# Patient Record
Sex: Female | Born: 1965
Health system: Southern US, Community
[De-identification: ages and names within clinical notes are randomized; demographics above are authoritative.]

## PROBLEM LIST (undated history)

## (undated) DIAGNOSIS — Z800 Family history of malignant neoplasm of digestive organs: Secondary | ICD-10-CM

## (undated) DIAGNOSIS — Z87442 Personal history of urinary calculi: Secondary | ICD-10-CM

## (undated) DIAGNOSIS — Z803 Family history of malignant neoplasm of breast: Secondary | ICD-10-CM

## (undated) DIAGNOSIS — K219 Gastro-esophageal reflux disease without esophagitis: Secondary | ICD-10-CM

## (undated) DIAGNOSIS — M1990 Unspecified osteoarthritis, unspecified site: Secondary | ICD-10-CM

## (undated) DIAGNOSIS — I10 Essential (primary) hypertension: Secondary | ICD-10-CM

## (undated) DIAGNOSIS — J45909 Unspecified asthma, uncomplicated: Secondary | ICD-10-CM

## (undated) DIAGNOSIS — R7303 Prediabetes: Secondary | ICD-10-CM

## (undated) DIAGNOSIS — E669 Obesity, unspecified: Secondary | ICD-10-CM

## (undated) HISTORY — DX: Family history of malignant neoplasm of breast: Z80.3

## (undated) HISTORY — PX: COLONOSCOPY: SHX174

## (undated) HISTORY — DX: Family history of malignant neoplasm of digestive organs: Z80.0

---

## 1998-08-12 ENCOUNTER — Encounter: Payer: Self-pay | Admitting: Emergency Medicine

## 1998-08-12 ENCOUNTER — Emergency Department (HOSPITAL_COMMUNITY): Admission: EM | Admit: 1998-08-12 | Discharge: 1998-08-12 | Payer: Self-pay | Admitting: Emergency Medicine

## 1998-09-19 ENCOUNTER — Other Ambulatory Visit: Admission: RE | Admit: 1998-09-19 | Discharge: 1998-09-19 | Payer: Self-pay | Admitting: Obstetrics and Gynecology

## 1999-06-11 ENCOUNTER — Ambulatory Visit (HOSPITAL_COMMUNITY): Admission: RE | Admit: 1999-06-11 | Discharge: 1999-06-11 | Payer: Self-pay | Admitting: Obstetrics and Gynecology

## 1999-06-11 ENCOUNTER — Encounter: Payer: Self-pay | Admitting: Obstetrics and Gynecology

## 1999-07-23 ENCOUNTER — Inpatient Hospital Stay (HOSPITAL_COMMUNITY): Admission: AD | Admit: 1999-07-23 | Discharge: 1999-07-23 | Payer: Self-pay | Admitting: Obstetrics & Gynecology

## 1999-10-31 ENCOUNTER — Encounter (INDEPENDENT_AMBULATORY_CARE_PROVIDER_SITE_OTHER): Payer: Self-pay

## 1999-10-31 ENCOUNTER — Inpatient Hospital Stay (HOSPITAL_COMMUNITY): Admission: AD | Admit: 1999-10-31 | Discharge: 1999-11-02 | Payer: Self-pay | Admitting: Obstetrics and Gynecology

## 2000-02-06 ENCOUNTER — Encounter: Payer: Self-pay | Admitting: Emergency Medicine

## 2000-02-06 ENCOUNTER — Emergency Department (HOSPITAL_COMMUNITY): Admission: EM | Admit: 2000-02-06 | Discharge: 2000-02-06 | Payer: Self-pay | Admitting: Emergency Medicine

## 2000-03-10 ENCOUNTER — Encounter: Admission: RE | Admit: 2000-03-10 | Discharge: 2000-03-10 | Payer: Self-pay | Admitting: Otolaryngology

## 2000-03-10 ENCOUNTER — Encounter: Payer: Self-pay | Admitting: Otolaryngology

## 2000-07-15 HISTORY — PX: ABLATION: SHX5711

## 2000-07-29 ENCOUNTER — Encounter: Payer: Self-pay | Admitting: Internal Medicine

## 2000-07-29 ENCOUNTER — Ambulatory Visit (HOSPITAL_COMMUNITY): Admission: RE | Admit: 2000-07-29 | Discharge: 2000-07-29 | Payer: Self-pay | Admitting: Internal Medicine

## 2000-07-29 ENCOUNTER — Encounter (INDEPENDENT_AMBULATORY_CARE_PROVIDER_SITE_OTHER): Payer: Self-pay | Admitting: Specialist

## 2001-10-01 ENCOUNTER — Ambulatory Visit (HOSPITAL_COMMUNITY): Admission: RE | Admit: 2001-10-01 | Discharge: 2001-10-01 | Payer: Self-pay | Admitting: Family Medicine

## 2001-10-01 ENCOUNTER — Encounter: Payer: Self-pay | Admitting: Family Medicine

## 2001-12-14 ENCOUNTER — Encounter: Payer: Self-pay | Admitting: Family Medicine

## 2001-12-14 ENCOUNTER — Ambulatory Visit (HOSPITAL_COMMUNITY): Admission: RE | Admit: 2001-12-14 | Discharge: 2001-12-14 | Payer: Self-pay | Admitting: Family Medicine

## 2002-01-06 ENCOUNTER — Encounter: Payer: Self-pay | Admitting: Cardiology

## 2002-01-06 ENCOUNTER — Ambulatory Visit (HOSPITAL_COMMUNITY): Admission: RE | Admit: 2002-01-06 | Discharge: 2002-01-06 | Payer: Self-pay | Admitting: Family Medicine

## 2002-05-25 ENCOUNTER — Encounter: Admission: RE | Admit: 2002-05-25 | Discharge: 2002-08-23 | Payer: Self-pay | Admitting: Family Medicine

## 2003-09-14 ENCOUNTER — Other Ambulatory Visit: Admission: RE | Admit: 2003-09-14 | Discharge: 2003-09-14 | Payer: Self-pay | Admitting: Obstetrics and Gynecology

## 2003-11-22 ENCOUNTER — Inpatient Hospital Stay (HOSPITAL_COMMUNITY): Admission: RE | Admit: 2003-11-22 | Discharge: 2003-11-23 | Payer: Self-pay | Admitting: Oral Surgery

## 2005-03-25 ENCOUNTER — Encounter: Admission: RE | Admit: 2005-03-25 | Discharge: 2005-03-25 | Payer: Self-pay | Admitting: Internal Medicine

## 2005-12-24 ENCOUNTER — Other Ambulatory Visit: Admission: RE | Admit: 2005-12-24 | Discharge: 2005-12-24 | Payer: Self-pay | Admitting: Obstetrics and Gynecology

## 2006-01-01 ENCOUNTER — Encounter: Admission: RE | Admit: 2006-01-01 | Discharge: 2006-01-01 | Payer: Self-pay

## 2006-04-03 ENCOUNTER — Ambulatory Visit (HOSPITAL_COMMUNITY): Admission: RE | Admit: 2006-04-03 | Discharge: 2006-04-03 | Payer: Self-pay | Admitting: Obstetrics and Gynecology

## 2006-04-03 ENCOUNTER — Encounter (INDEPENDENT_AMBULATORY_CARE_PROVIDER_SITE_OTHER): Payer: Self-pay | Admitting: Specialist

## 2010-07-02 ENCOUNTER — Encounter
Admission: RE | Admit: 2010-07-02 | Discharge: 2010-07-02 | Payer: Self-pay | Source: Home / Self Care | Attending: Family Medicine | Admitting: Family Medicine

## 2010-11-30 NOTE — Discharge Summary (Signed)
[facility]  Patient:    Christine Howell, Christine Howell                        MRN: [id number] Adm. Date:  [id number] Disc. Date: 11/02/99 Attending:  Shaune Spittle Dictator:   Miguel Dibble, C.N.M.                           Discharge Summary  DATE OF BIRTH:                February 03, 1966  ADMISSION DIAGNOSES:          1. Intrauterine pregnancy at term in active labor.                               2. Positive group Beta strep.                               3. Multiparity desiring permanent sterilization.  DISCHARGE DIAGNOSES:          1. Intrauterine pregnancy at term in active labor.                               2. Positive group Beta strep.                               3. Multiparity desiring permanent sterilization.                               4. Delivered viable baby girl, weight 7 pounds                                  15 ounces, Apgars 6 and 8.  PROCEDURES:                   1. Epidural and general anesthesia.                               2. Bilateral tubal ligation for permanent                                  sterilization.                               3. Electronic fetal monitoring.                               4. Repair of second degree midline laceration.  COURSE OF HOSPITALIZATION:    On April 18, 45 year old [name] was admitted in early active labor, 3 cm, completely effaced, vertex at -1 with bulging bag of waters.  She was treated for positive group Beta strep.  Approximately one hour  after admission, at approximately 1550, she had a normal spontaneous vaginal delivery over an intact perineum of a viable baby girl, Natashia, weighing 7 pounds 15 ounces,  Apgars 6 and 8.  She had her second degree midline perineal laceration repaired without difficulty.  She requested a postpartum tubal ligation, which as performed on postpartum day #1, April 19.  This was an uncomplicated procedure. On April 20, postoperative day  #1/postpartum day #2, her vital signs were stable. She was afebrile.  Her hemoglobin had dropped from 13 prior to delivery to 10.7 after delivery, with a white count of 14.7.  She remained afebrile and was recovering  satisfactorily, her incision clean, dry, and intact.  She was discharged after having been deemed to have received the full benefit of hospitalization.  DISCHARGE INSTRUCTIONS:       Per [facility] booklet.  DISCHARGE MEDICATIONS:        Tylox, Motrin, prenatal vitamins, over-the-counter iron.  DISCHARGE FOLLOW-UP:          In six weeks and [facility]. DD:  11/02/99 TD:  11/02/99 Job: [id number]

## 2010-11-30 NOTE — Op Note (Signed)
NAME:  Christine Howell, Christine Howell NO.:  [id number]   MEDICAL RECORD NO.:  [id number]          PATIENT TYPE:  AMB   LOCATION:  SDC                           FACILITY:  [facility]   PHYSICIAN:  Janine Limbo, M.D.DATE OF BIRTH:  Aug 17, 1965   DATE OF PROCEDURE:  04/03/2006  DATE OF DISCHARGE:                                 OPERATIVE REPORT   PREOPERATIVE DIAGNOSIS:  1. Menorrhagia  2. Endometrial polyp.   POSTOPERATIVE DIAGNOSIS:  1. Menorrhagia  2. Endometrial polyp.   PROCEDURE:  1. Hysteroscopy with resection of an endometrial polyp.  2. Dilatation and curettage.  3. Novasure ablation of the endometrium.   SURGEON:  Janine Limbo, M.D.   FIRST ASSISTANT:  None.   ANESTHESIA:  General.   DISPOSITION:  Christine Howell is a 45 year old female, para 2-0-1-2, who  presents with the above mentioned diagnosis.  The patient understands the  indications for her surgical procedure and she accepts the risks of, but not  limited to, anesthetic complications, bleeding, infection, and possible  damage to surrounding organs.   FINDINGS:  The uterus was upper limits of normal size.  No adnexal masses  were appreciated.  On hysteroscopy, the patient was noted to have a 1 cm  endometrial polyp on the right anterior surface of the uterus.   PROCEDURE:  The patient was taken to the operating room where a general  anesthetic was given.  The patient's abdomen, perineum, and vagina were  prepped with multiple layers of Betadine.  The bladder was drained of urine.  The patient was then sterilely draped.  Examination under anesthesia was  performed.  A paracervical block was placed using 10 mL of 0.5% Marcaine  with epinephrine.  An additional 10 mL of 0.5% Marcaine with epinephrine  were injected directly into the cervix.  An endocervical curettage was then  obtained.  The uterus was sounded to 9 cm.  The cervix was gently dilated.  The diagnostic hysteroscope was inserted  and the endometrial cavity was  carefully inspected.  Pictures were taken.  The diagnostic hysteroscope was  removed and the operative hysteroscope was then inserted.  The polyp was  removed using the double loop resection instrument.  Hemostasis was  confirmed.  The cavity was then curetted until the entire cavity was felt to  be clean.  The instruments were removed and the Novasure apparatus was  inserted.  The cavity width was noted to be 3.8 cm.  The cervical length was  noted to be 4 cm.  Therefore, the cavity length was felt to be 5 cm.  The  endometrial cavity was then ablated for total of 75 seconds.  At the end of  the ablation cycle, all instruments were removed.  The hysteroscope was  again inserted and the cavity was carefully inspected.  The cavity was noted  to be totally ablated.  Examination under anesthesia was then repeated.  The  patient was returned to the supine position.  She was awakened from her  anesthetic and taken to the recovery room in stable condition.  Sponge,  needle, and instrument counts were correct.  The  estimated blood loss for  this procedure was 20 mL.  Lactated Ringer's was used for the initial  hysteroscopy portion of the procedure.  The lactated Ringer's was irrigated  from the system and then sorbitol was used for the resection portion of this  procedure.  The sorbitol was vigorously irrigated and then lactated Ringer's  was again used for the ablation portion of the procedure.  There was a 60 mL  deficit of lactated Ringer's.  There was a 45 mL deficit of sorbitol.  Fluid  was noted to be on the operating room floor, however.  The endocervical  curettage, the endometrial resections, and the endometrial curettings were  all sent to pathology for evaluation.  The patient was taken to the recovery  room in stable condition.   FOLLOW-UP INSTRUCTIONS:  The patient will take ibuprofen 600 mg every 6  hours as needed for mild to moderate pain.  She will  take Vicodin 1-2  tablets every 4 hours as needed for severe pain.  She will return to see Dr.  Stefano Gaul in two weeks for follow-up examination.  She was given a copy of  the postoperative instruction sheet as prepared by the [facility]  [facility] for patients who have undergone a dilatation and curettage.      Janine Limbo, M.D.  Electronically Signed     AVS/MEDQ  D:  04/03/2006  T:  04/05/2006  Job:  [id number]

## 2010-11-30 NOTE — Op Note (Signed)
[facility]  Patient:    Christine Howell, Christine Howell                        MRN: [id number] Proc. Date: 11/01/99 Adm. Date:  [id number] Attending:  Dierdre Forth Pearline                           Operative Report  PREOPERATIVE DIAGNOSIS:       Postpartum elective sterilization.  POSTOPERATIVE DIAGNOSIS:      Postpartum elective sterilization.  OPERATION PERFORMED:          Postpartum tubal ligation by Pomeroy method.  SURGEON:                      Georgina Peer, M.D.  ANESTHESIA:                   Epidural, converted to general because of ineffective epidural, Dr. Dorinda Hill T. Shafer.  BLOOD LOSS:                   Less than 50 cc.  FINDINGS:                     Normal tubes and ovaries.  INDICATIONS:                  This 45 year old female, gravida 3, para 2, delivered and wanted tubal ligation.  She was aware of risks and complications of tubal ligation including bleeding, infection, 1-2% lifetime failure risk and was willing to proceed.  DESCRIPTION OF PROCEDURE:     Patient was taken to the operating room.  Her epidural was redosed but after 35 minutes of waiting, the patient had still severe pain with an Allis test.  It was decided by Dr. Pamalee Leyden that a general anesthetic would be at her best interest and she then had a general anesthesia.  Her abdomen was prepped and draped.  A subumbilical incision was made and taken through the  fascia and into the peritoneal cavity sharply.  There were no adhesions around he site.  The right tube was identified and traced to its fimbriated end, a midportion knuckle elevated and two loops of plain gut placed around it and the midportion  excised.  Bleeders were cauterized.  The left tube was similarly identified, traced to its fimbriated end, midportion elevated, two plain ties tied around it and the midportion excised.  Bleeders were cauterized.  The fascia was closed with 0 Vicryl, 10 cc of Marcaine  0.25% injected in the subcutaneous tissue and the skin closed with Dexon.  Sponge, needle and instrument counts were correct.  Patient  tolerated the procedure well and was sent to recovery area in good condition. DD:  11/01/99 TD:  11/02/99 Job: [id number]

## 2010-11-30 NOTE — Op Note (Signed)
NAME:  Christine, Howell NO.:  [id number]   MEDICAL RECORD NO.:  [id number]                   PATIENT TYPE:  INP   LOCATION:  2550                                 FACILITY:  [facility]   PHYSICIAN:  Sable Feil., D.D.S.        DATE OF BIRTH:  04-Dec-1965   DATE OF PROCEDURE:  11/22/2003  DATE OF DISCHARGE:                                 OPERATIVE REPORT   PREOPERATIVE DIAGNOSIS:  Posterior vertical maxillary hyperplasia and  apertognathia.   POSTOPERATIVE DIAGNOSIS:  Posterior vertical maxillary hyperplasia and  apertognathia.   PROCEDURE:  Ignatius Specking I maxillary osteotomy with rigid internal fixation.   ANESTHESIA:  General via naso-endotracheal intubation.   PROCEDURE IN DETAIL:  The patient was brought to the operating room in  satisfactory preoperative condition and placed on the operating room table  in the supine position.  Following successful induction of general  anesthesia via naso-endotracheal intubation, the patient was prepped and  draped in the usual sterile fashion for a procedure of this type.  Initially, the oral cavity was irrigated with normal saline and suctioned  dry and an oropharyngeal throat pack was placed which was removed at the  conclusion of the case.  Next, the maxilla was anesthetized with  approximately 4 cc of a 0.5% Marcaine solution with 1:200,000 epinephrine as  well as 2 cc of a 2% lidocaine solution with 1:100,000 epinephrine.  After  allowing adequate time for local anesthesia, Eric arch bars were then  adapted to the lateral surfaces of the maxillary and mandibular teeth and  secured to the maxilla and mandible with 24 and 26-guage stainless steel  wires.  Next, an incision was created in the maxillary vestibule.  The  incision extended from the first premolar to the first premolar and was  created approximately 1 cm superior to the gingival margin.  The incision  extended down through soft tissue and  periosteum.  A periosteal elevator was  next utilized to raise a full-thickness periosteal mucoperiosteal flap  thereby exposing the anterior and lateral aspects of the maxilla.  The  infraorbital nerves were located and protected with retractors.  The  periosteal elevator was used to reflect a soft tissue over the piriform rim.  The curved freer was used to elevate the nasal mucosa from the superior  aspect of the maxilla.  Next, a reciprocating saw was utilized to create the  maxillary osteotomies.  This was done under copious irrigation.  The cuts  were made just inferior to the maxillary buttresses bilaterally and extended  anteriorly to the piriform rim region.  The osteotomies were then completed  with a spatula osteotome and a guarded osteotome in the lateral nasal wall  regions.  A nasal septal osteotome was then utilized to separate the nasal  septum from the superior aspect of the maxilla.  Following this, the  pterygomaxillary osteotomes were utilized to create the osteotomies  posterior  to the maxilla in the region of the pterygoid plates.  The  patient's mean arterial pressure was then lowered to approximately 65 in  preparation for the maxillary down fracture.  This was then carried out  without difficulty.  Again, the nasal mucosa was elevated from the superior  and posterior aspect of the maxilla.  Bone was then removed from the  superior aspect of the maxilla with rongeurs and as well with a pear-shaped  bur on the drill under copious irrigation.  Bone was removed primarily in  the posterior aspects in the buttress regions in order to correct the  vertical maxillary access.  Once adequate bone was removed, the nasal mucosa  was reapproximated with a 4-0 chromic gut suture.  The maxilla was then  repositioned into its corrected position and the patient was placed into the  maxillary and mandibular fixation with multiple stainless steel wires.  The  maxilla was again  repositioned into its corrected position and then fixated  with Leibinger locking plates.  Four plates were used in total.  The plates  were 1.7 mm in thickness and were five holed, L-shaped plates.  A total of  20 screws which were 1.7 x 4 mm were utilized to secure the plates.  The  maxillomandibular fixation was then released and the patient was noted to  have a corrected class I repeatable passive occlusion.  The operative sites  were then thoroughly irrigated and suctioned dry.  Next, a nasal synch  suture was placed with a 2-0 Vicryl suture.  The upper lip was then closed  in a VY fashion with 4-0 Vicryl suture and the maxillary incision was closed  with a running 4-0 Vicryl suture as well.  This completed the maxillary Le  Forte I osteotomy.  The throat pack was removed and the patient was placed  into elastic fixation.  The patient was then allowed to recover from general  anesthesia and was extubated and transported to the post anesthesia care  unit in satisfactory condition.  All sponge, needle and instrument counts  were correct at the conclusion of the case.   ASSISTANT SURGEON:  Dr. Barbara Cower Mohorn.   ASSISTANT:  Thyra Breed.                                               Sable Feil., D.D.S.    JWB/MEDQ  D:  11/22/2003  T:  11/23/2003  Job:  [id number]

## 2010-11-30 NOTE — H&P (Signed)
NAME:  Christine Howell, Christine Howell NO.:  [id number]   MEDICAL RECORD NO.:  [id number]          PATIENT TYPE:  AMB   LOCATION:  SDC                           FACILITY:  [facility]   PHYSICIAN:  Janine Limbo, M.D.DATE OF BIRTH:  12-Feb-1966   DATE OF ADMISSION:  DATE OF DISCHARGE:                                HISTORY & PHYSICAL   HISTORY OF PRESENT ILLNESS:  The patient is a 45 year old female, para 2-0-1-  2, who presents for a hysteroscopy with dilatation and curettage and  NovaSure ablation.  The patient has been followed at the [facility].  She  has a history of heavy uterine bleeding that has not responded to birth  control pills or other conservative measures.  The patient had an  endometrial biopsy performed that showed a benign endometrium.  The patient  had a sonohysterogram performed, and there is a question of a polyp versus a  fibroid measuring 1.71 cm in the posterior endometrium.  Her most recent Pap  smear is within normal limits.   OBSTETRICAL HISTORY:  The patient had a vaginal delivery at term in 1993.  She had a vaginal delivery at term in 2001.  She had a miscarriage in 40.   DRUG ALLERGIES:  No known drug allergies.   PAST MEDICAL HISTORY:  The patient denies hypertension and diabetes.  She  has had a bilateral tubal ligation.   SOCIAL HISTORY:  The patient denies cigarette use, alcohol use, and  recreational drug use.   FAMILY HISTORY:  Noncontributory.   PHYSICAL EXAMINATION:  VITAL SIGNS:  Weight is 268 pounds.  HEENT:  Within normal limits.  CHEST:  Clear.  HEART:  Regular rate and rhythm.  BREASTS:  Without masses.  ABDOMEN:  Nontender.  EXTREMITIES:  Grossly normal, and her neurologic exam is grossly normal.  PELVIC:  External genitalia is normal.  Vagina is normal.  Cervix is  nontender.  Uterus is upper limits normal size.  Adnexa:  No masses and  rectovaginal  exam confirms.   ASSESSMENT:  1. Menorrhagia.  2. Endometrial polyp versus an endometrial fibroid.   PLAN:  The patient will undergo a dilatation and curettage with  hysteroscopy.  She may have resection of an endometrial lesion.  She will  then have a NovaSure ablation.  She understands the indications for her  surgical procedure, and she accepts the risk of but not limited to,  anesthetic complications, bleeding, infections and possible damage to the  surrounding organs.      Janine Limbo, M.D.  Electronically Signed     AVS/MEDQ  D:  03/28/2006  T:  03/28/2006  Job:  [id number]
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
## 2016-02-16 ENCOUNTER — Ambulatory Visit (INDEPENDENT_AMBULATORY_CARE_PROVIDER_SITE_OTHER): Payer: Managed Care, Other (non HMO)

## 2016-02-16 ENCOUNTER — Ambulatory Visit (INDEPENDENT_AMBULATORY_CARE_PROVIDER_SITE_OTHER): Payer: Managed Care, Other (non HMO) | Admitting: Podiatry

## 2016-02-16 VITALS — BP 143/87 | HR 81 | Resp 14

## 2016-02-16 DIAGNOSIS — R52 Pain, unspecified: Secondary | ICD-10-CM | POA: Diagnosis not present

## 2016-02-16 DIAGNOSIS — M2010 Hallux valgus (acquired), unspecified foot: Secondary | ICD-10-CM

## 2016-02-16 NOTE — Progress Notes (Signed)
   Subjective:    Patient ID: Christine Howell, female    DOB: 04/24/1966, 50 y.o.   MRN: [id number]  HPI  50 year old female presents the also concerns of pain in the right foot and she has noticed a knot on the side of her big toe which is been ongoing for about 2 months and his pain for a period she's had no recent injury or trauma. No numbness or tingling. No swelling but she does get some occasional redness in this area mostly with shoe gear and pressure. She's had no recent treatment. No other complaints at this time. Review of Systems  Gastrointestinal: Positive for abdominal pain and nausea.       Objective:   Physical Exam General: AAO x3, NAD  Dermatological: Skin is warm, dry and supple bilateral. Nails x 10 are well manicured; remaining integument appears unremarkable at this time. There are no open sores, no preulcerative lesions, no rash or signs of infection present.  Vascular: Dorsalis Pedis artery and Posterior Tibial artery pedal pulses are 2/4 bilateral with immedate capillary fill time. Pedal hair growth present. There is no pain with calf compression, swelling, warmth, erythema.   Neruologic: Grossly intact via light touch bilateral. Vibratory intact via tuning fork bilateral. Protective threshold with Semmes Wienstein monofilament intact to all pedal sites bilateral.  Musculoskeletal: Moderate HAV is present on the right foot and there is irritation on the medial aspect of the first metatarsal head from shoe gear. There is no pain or crepitation first MPJ range of motion. Tenderness palpation of the medial aspect of the first metatarsal head. There is no overlying edema, erythema, increase in warmth. No other areas of tenderness bilaterally. MMT 5/5. Range of motion intact.  Gait: Unassisted, Nonantalgic.      Assessment & Plan:  50 year old female right symptomatic HAV -Treatment options discussed including all alternatives, risks, and complications -Etiology of  symptoms were discussed -X-rays were obtained and reviewed with the patient. Moderate HAV is present. No evidence of acute fracture at this time. -I discussed with her both conservative and surgical treatment options. Conservative discussed with her steroid injection, offering had periodically has her dispensed to her today. Discussed shoe modifications as well. I discussed with her surgical intervention. I discussed with her Eliberto Ivory bunionectomy with screw fixation. She'll consider surgical intervention should her symptoms continue. -Follow-up her discretion or sooner if any issues are to arise  Ovid Curd, DPM

## 2016-02-23 DIAGNOSIS — M2010 Hallux valgus (acquired), unspecified foot: Secondary | ICD-10-CM | POA: Insufficient documentation

## 2016-03-20 ENCOUNTER — Telehealth: Payer: Self-pay | Admitting: *Deleted

## 2016-03-20 NOTE — Telephone Encounter (Signed)
"  I'm calling to set up my surgery.  Dr. Ardelle AntonWagoner told me to call when I decided to have surgery."  Have you signed consent forms?  "No, I don't think I have."  I can pencil you in tentatively.  You will need to see Dr. Ardelle AntonWagoner for a consultation to sign consent forms.  Do you have a date in mind?  "How long will I be out of work?"  If you have a sitting position you can go after a week as long as you can go and elevate.  If you stand.  You could be out for 6-8 weeks.  "Let's schedule it for 05/08/2016."  I'll will put you down tentatively.  Would you like to schedule a consulation with Dr. Ardelle AntonWagoner now?  "Yes, that will be fine."    I transferred patient to an appointment scheduler.

## 2016-04-05 ENCOUNTER — Ambulatory Visit (INDEPENDENT_AMBULATORY_CARE_PROVIDER_SITE_OTHER): Payer: Managed Care, Other (non HMO) | Admitting: Podiatry

## 2016-04-05 ENCOUNTER — Encounter: Payer: Self-pay | Admitting: Podiatry

## 2016-04-05 DIAGNOSIS — R52 Pain, unspecified: Secondary | ICD-10-CM

## 2016-04-05 DIAGNOSIS — M2010 Hallux valgus (acquired), unspecified foot: Secondary | ICD-10-CM | POA: Diagnosis not present

## 2016-04-05 NOTE — Patient Instructions (Signed)

## 2016-04-07 NOTE — Progress Notes (Signed)
Subjective: 50 year old female presents the office today for continued pain to her right long the bunion. She has attempted conservative treatment including shoe gear modifications, padding, offloading without significant relief of symptoms at this time due to the continued pain she wishes to proceed with surgical intervention. She presents today for surgical consultation. She states that she continues to have pain with pressure in shoe gear and is becoming more consistent. Denies any systemic complaints such as fevers, chills, nausea, vomiting. No acute changes since last appointment, and no other complaints at this time.   Objective: AAO x3, NAD DP/PT pulses palpable bilaterally, CRT less than 3 seconds Protective sensation intact with Simms Weinstein monofilament Moderate HAV is present on the right foot with tenderness on the medial aspect of the first metatarsal head. This by irritation from shoe gear. There is no open lesions. There is no crepitation or restriction first MTPJ range of motion. There is no hypermobility present. No other areas of tenderness are present bilaterally. No areas of pinpoint bony tenderness or pain with vibratory sensation. MMT 5/5, ROM WNL. No edema, erythema, increase in warmth to bilateral lower extremities.  No open lesions or pre-ulcerative lesions.  No pain with calf compression, swelling, warmth, erythema  Assessment: Right foot symptomatic HAV  Plan: -All treatment options discussed with the patient including all alternatives, risks, complications.  -I discussed again stable conservative and surgical treatment options. This time she is exhausted conservative treatment any relief of symptoms and she is requesting surgical intervention. Discussed this is not a guarantee resolution of symptoms were resolving pain and she understands this. -Discussed with her right foot Austin bunionectomy with screw fixation  -The incision placement as well as the  postoperative course was discussed with the patient. I discussed risks of the surgery which include, but not limited to, infection, bleeding, pain, swelling, need for further surgery, delayed or nonhealing, painful or ugly scar, numbness or sensation changes, over/under correction, recurrence, transfer lesions, further deformity, hardware failure, DVT/PE, loss of toe/foot. Patient understands these risks and wishes to proceed with surgery. The surgical consent was reviewed with the patient all 3 pages were signed. No promises or guarantees were given to the outcome of the procedure. All questions were answered to the best of my ability. Before the surgery the patient was encouraged to call the office if there is any further questions. [facility]. -Patient encouraged to call the office with any questions, concerns, change in symptoms.  -CAM boot dispensed to bring to surgery  Ovid CurdMatthew Wagoner, DPM

## 2016-05-07 ENCOUNTER — Telehealth: Payer: Self-pay | Admitting: *Deleted

## 2016-05-07 NOTE — Telephone Encounter (Signed)
"  I need to cancel my surgery that is on October 25th."  I called and left a message for patient that surgery was canceled.

## 2016-08-29 ENCOUNTER — Other Ambulatory Visit: Payer: Self-pay | Admitting: Gastroenterology

## 2016-08-29 DIAGNOSIS — R1084 Generalized abdominal pain: Secondary | ICD-10-CM

## 2016-09-25 ENCOUNTER — Emergency Department (HOSPITAL_COMMUNITY): Payer: Managed Care, Other (non HMO)

## 2016-09-25 ENCOUNTER — Encounter (HOSPITAL_COMMUNITY): Payer: Self-pay | Admitting: *Deleted

## 2016-09-25 ENCOUNTER — Emergency Department (HOSPITAL_COMMUNITY)
Admission: EM | Admit: 2016-09-25 | Discharge: 2016-09-26 | Disposition: A | Discharge status: Home / Self Care | Payer: Managed Care, Other (non HMO) | Attending: Emergency Medicine | Admitting: Emergency Medicine

## 2016-09-25 DIAGNOSIS — M542 Cervicalgia: Secondary | ICD-10-CM | POA: Diagnosis not present

## 2016-09-25 DIAGNOSIS — R51 Headache: Secondary | ICD-10-CM | POA: Diagnosis not present

## 2016-09-25 DIAGNOSIS — M25562 Pain in left knee: Secondary | ICD-10-CM | POA: Diagnosis not present

## 2016-09-25 DIAGNOSIS — Y999 Unspecified external cause status: Secondary | ICD-10-CM | POA: Diagnosis not present

## 2016-09-25 DIAGNOSIS — Y939 Activity, unspecified: Secondary | ICD-10-CM | POA: Insufficient documentation

## 2016-09-25 DIAGNOSIS — Y92410 Unspecified street and highway as the place of occurrence of the external cause: Secondary | ICD-10-CM | POA: Diagnosis not present

## 2016-09-25 DIAGNOSIS — S3992XA Unspecified injury of lower back, initial encounter: Secondary | ICD-10-CM | POA: Diagnosis not present

## 2016-09-25 HISTORY — DX: Obesity, unspecified: E66.9

## 2016-09-25 MED ORDER — ACETAMINOPHEN 500 MG PO TABS
1000.0000 mg | ORAL_TABLET | Freq: Once | ORAL | Status: AC
  Administered 2016-09-25: 1000 mg via ORAL
  Filled 2016-09-25: qty 2

## 2016-09-25 MED ORDER — METHOCARBAMOL 500 MG PO TABS: 500.0000 mg | ORAL_TABLET | Freq: Two times a day (BID) | ORAL | 0 refills | Status: DC

## 2016-09-25 MED ORDER — NAPROXEN 500 MG PO TABS: 500.0000 mg | ORAL_TABLET | Freq: Two times a day (BID) | ORAL | 0 refills | Status: DC

## 2016-09-25 MED ORDER — HYDROCODONE-ACETAMINOPHEN 5-325 MG PO TABS: 1.0000 | ORAL_TABLET | ORAL | 0 refills | Status: DC | PRN

## 2016-09-25 NOTE — ED Notes (Signed)
See EDP assessment 
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
## 2016-09-25 NOTE — Discharge Instructions (Signed)
Take your medications as prescribed. I also recommend applying ice and/or heat to affected area for 15-20 minutes 3-4 times daily for additional pain relief. Refrain from doing any heavy lifting, squatting or repetitive movements that exacerbate your symptoms. Follow-up with your primary care provider in the next week if her symptoms have not improved.  Please return to the Emergency Department if symptoms worsen or new onset of fever, neck stiffness, visual changes, photophobia, abdominal pain, vomiting, urinary symptoms, numbness, tingling, weakness, seizures, syncope, loss of control of bowel/bladder, groin numbness.

## 2016-09-25 NOTE — ED Notes (Signed)
Patient transported to X-ray 
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
## 2017-03-07 ENCOUNTER — Emergency Department (HOSPITAL_BASED_OUTPATIENT_CLINIC_OR_DEPARTMENT_OTHER): Payer: Managed Care, Other (non HMO)

## 2017-03-07 ENCOUNTER — Emergency Department (HOSPITAL_BASED_OUTPATIENT_CLINIC_OR_DEPARTMENT_OTHER)
Admission: EM | Admit: 2017-03-07 | Discharge: 2017-03-07 | Disposition: A | Discharge status: Home / Self Care | Payer: Managed Care, Other (non HMO) | Attending: Emergency Medicine | Admitting: Emergency Medicine

## 2017-03-07 ENCOUNTER — Encounter (HOSPITAL_BASED_OUTPATIENT_CLINIC_OR_DEPARTMENT_OTHER): Payer: Self-pay | Admitting: *Deleted

## 2017-03-07 DIAGNOSIS — M722 Plantar fascial fibromatosis: Secondary | ICD-10-CM | POA: Diagnosis not present

## 2017-03-07 DIAGNOSIS — Z79899 Other long term (current) drug therapy: Secondary | ICD-10-CM | POA: Diagnosis not present

## 2017-03-07 DIAGNOSIS — M79672 Pain in left foot: Secondary | ICD-10-CM | POA: Diagnosis present

## 2017-03-07 MED ORDER — DICLOFENAC SODIUM 1 % TD GEL: 2.0000 g | Freq: Four times a day (QID) | TRANSDERMAL | 0 refills | Status: DC

## 2017-03-07 NOTE — Discharge Instructions (Signed)
You have been seen today for foot pain. There is a heel spur that seems to be causing plantar fasciitis. Pain: Take 600 mg of ibuprofen every 6 hours or 440 mg (over the counter dose) to 500 mg (prescription dose) of naproxen every 12 hours or for the next 3 days. After this time, these medications may be used as needed for pain. Take these medications with food to avoid upset stomach. Choose only one of these medications, do not take them together.  Tylenol: Should you continue to have additional pain while taking the ibuprofen or naproxen, you may add in tylenol as needed. Your daily total maximum amount of tylenol from all sources should be limited to 4000mg /day for persons without liver problems, or 2000mg /day for those with liver problems. Diclofenac: As an alternative to the above suggestions, you may try applying the diclofenac gel to the painful area. Ice: May apply ice to the area over the next 24 hours for 15 minutes at a time to reduce swelling. Elevation: Keep the extremity elevated as often as possible to reduce pain and inflammation. Exercises: Start by performing these exercises a few times a week, increasing the frequency until you are performing them twice daily.  Follow up: Follow up with the podiatrist on this matter.

## 2017-03-07 NOTE — ED Provider Notes (Signed)
[facility]   CSN: [id number] Arrival date & time: 03/07/17  1741     History   Chief Complaint Chief Complaint  Patient presents with  . Foot Pain    HPI Christine Howell is a 51 y.o. female.  HPI   Christine Howell is a 51 y.o. female, with a history of Obesity, presenting to the ED with Left foot and heel pain over the past couple weeks. Pain is moderate, worse with walking, sharp, radiating into the mid foot. She has not tried any therapies other than applying an Ace bandage around the foot. Denies numbness, tingling, weakness, injuries, or any other complaints.     Past Medical History:  Diagnosis Date  . Obesity     Patient Active Problem List   Diagnosis Date Noted  . HAV (hallux abducto valgus) 02/23/2016    History reviewed. No pertinent surgical history.  OB History    No data available       Home Medications    Prior to Admission medications   Medication Sig Start Date End Date Taking? Authorizing Provider  diclofenac sodium (VOLTAREN) 1 % GEL Apply 2 g topically 4 (four) times daily. 03/07/17   Jessah Danser C, PA-C  HYDROcodone-acetaminophen (NORCO/VICODIN) 5-325 MG tablet Take 1 tablet by mouth every 4 (four) hours as needed. 09/25/16   Barrett Henle, PA-C  methocarbamol (ROBAXIN) 500 MG tablet Take 1 tablet (500 mg total) by mouth 2 (two) times daily. 09/25/16   Barrett Henle, PA-C  naproxen (NAPROSYN) 500 MG tablet Take 1 tablet (500 mg total) by mouth 2 (two) times daily. 09/25/16   Barrett Henle, PA-C    Family History No family history on file.  Social History Social History  Substance Use Topics  . Smoking status: Never Smoker  . Smokeless tobacco: Never Used  . Alcohol use No     Allergies   Seasonal ic [cholestatin]   Review of Systems Review of Systems  Musculoskeletal: Positive for arthralgias. Negative for joint swelling.  Neurological: Negative for weakness  and numbness.     Physical Exam Updated Vital Signs BP (!) 160/108 (BP Location: Right Arm)   Pulse 86   Temp 98.8 F (37.1 C) (Oral)   Resp 16   Ht 5' 7.5" (1.715 m)   Wt 113.4 kg (250 lb)   SpO2 98%   BMI 38.58 kg/m   Physical Exam  Constitutional: She appears well-developed and well-nourished. No distress.  HENT:  Head: Normocephalic and atraumatic.  Eyes: Conjunctivae are normal.  Neck: Neck supple.  Cardiovascular: Normal rate, regular rhythm and intact distal pulses.   Pulmonary/Chest: Effort normal.  Musculoskeletal: She exhibits tenderness.  Tenderness to the left heel and surrounding plantar surface. Increased pain with dorsiflexion at the toes. Patient is wearing flip-flops. She is ambulatory. No noted erythema, swelling, crepitus, or instability. Full range of motion in the patient's left foot and ankle.  Neurological: She is alert.  No sensory deficits in the left lower extremity. Strength in the toes and ankle 5/5.  Skin: Skin is warm and dry. Capillary refill takes less than 2 seconds. She is not diaphoretic.  Psychiatric: She has a normal mood and affect. Her behavior is normal.  Nursing note and vitals reviewed.    ED Treatments / Results  Labs (all labs ordered are listed, but only abnormal results are displayed) Labs Reviewed - No data to display  EKG  EKG Interpretation None  Radiology Dg Foot Complete Left  Result Date: 03/07/2017 CLINICAL DATA:  Left heel pain. EXAM: LEFT FOOT - COMPLETE 3+ VIEW COMPARISON:  None. FINDINGS: Prominent calcification is present at the Achilles tendon insertion. This is likely degenerative. A small plantar calcaneal spur is noted. No acute bone or soft tissue abnormality is present. There is no radiopaque foreign body. IMPRESSION: 1. Calcaneal spurs including at the attachment site of the Achilles tendon. 2. No acute abnormality. Electronically Signed   By: Marin Roberts M.D.   On: 03/07/2017 18:21     Procedures Procedures (including critical care time)  Medications Ordered in ED Medications - No data to display   Initial Impression / Assessment and Plan / ED Course  I have reviewed the triage vital signs and the nursing notes.  Pertinent labs & imaging results that were available during my care of the patient were reviewed by me and considered in my medical decision making (see chart for details).     Patient presents with left foot pain. Suspect plantar fasciitis. Calcaneal spur noted on x-ray. Podiatry follow-up. The patient was given instructions for home care as well as return precautions. Patient voices understanding of these instructions, accepts the plan, and is comfortable with discharge.     Final Clinical Impressions(s) / ED Diagnoses   Final diagnoses:  Plantar fasciitis of left foot    New Prescriptions Discharge Medication List as of 03/07/2017  6:44 PM    START taking these medications   Details  diclofenac sodium (VOLTAREN) 1 % GEL Apply 2 g topically 4 (four) times daily., Starting Fri 03/07/2017, Print         Azari Janssens C, PA-C 03/08/17 1610    Benjiman Core, MD 03/15/17 [phone]

## 2017-03-07 NOTE — ED Triage Notes (Signed)
Pain in her left foot with no injury. She is wearing a compression bandage with no improvement.

## 2017-12-16 ENCOUNTER — Other Ambulatory Visit: Payer: Self-pay

## 2017-12-16 ENCOUNTER — Encounter (HOSPITAL_COMMUNITY): Payer: Self-pay | Admitting: Emergency Medicine

## 2017-12-16 ENCOUNTER — Ambulatory Visit (HOSPITAL_COMMUNITY)
Admission: EM | Admit: 2017-12-16 | Discharge: 2017-12-16 | Disposition: A | Discharge status: Home / Self Care | Payer: Managed Care, Other (non HMO) | Attending: Physician Assistant | Admitting: Physician Assistant

## 2017-12-16 DIAGNOSIS — I10 Essential (primary) hypertension: Secondary | ICD-10-CM

## 2017-12-16 MED ORDER — AMLODIPINE BESYLATE 5 MG PO TABS: 5.0000 mg | ORAL_TABLET | Freq: Every day | ORAL | 0 refills | Status: AC

## 2017-12-16 MED ORDER — AMLODIPINE BESYLATE 5 MG PO TABS: 5.0000 mg | ORAL_TABLET | Freq: Every day | ORAL | 0 refills | Status: DC

## 2017-12-16 MED ORDER — AMLODIPINE BESYLATE 5 MG PO TABS: 5.0000 mg | ORAL_TABLET | Freq: Every day | ORAL | Status: DC

## 2017-12-16 NOTE — ED Triage Notes (Signed)
The patient presented to the [facility] with a complaint of HTN. 

## 2017-12-16 NOTE — ED Provider Notes (Signed)
12/16/2017 8:20 PM   DOB: 01/15/1966 / MRN: [id number]  SUBJECTIVE:  Christine Howell is a 52 y.o. female presenting for asymptomatic hypertension.  Patient was at her gastroenterologist and was advised that she had a pressure of 170/100 and she was advised to go to her primary care provider.  She called her primary compare for provider who was not available to see her until late June.  She does have that appointment scheduled however.  At this time she denies headache, chest pain, vision changes, leg swelling, orthopnea, disproportionate DOE.  She is allergic to seasonal ic [cholestatin].   She  has a past medical history of Obesity.    She  reports that she has never smoked. She has never used smokeless tobacco. She reports that she does not drink alcohol or use drugs. She  has no sexual activity history on file. The patient  has no past surgical history on file.  Her family history is not on file.  ROS per HPI  OBJECTIVE:  BP (!) 158/87 (BP Location: Left Arm)   Pulse 88   Temp 98.5 F (36.9 C) (Oral)   Resp 18   SpO2 97%   Wt Readings from Last 3 Encounters:  03/07/17 250 lb (113.4 kg)   Temp Readings from Last 3 Encounters:  12/16/17 98.5 F (36.9 C) (Oral)  03/07/17 98.8 F (37.1 C) (Oral)  09/25/16 98.5 F (36.9 C)   BP Readings from Last 3 Encounters:  12/16/17 (!) 158/87  03/07/17 (!) 161/89  09/26/16 147/65   Pulse Readings from Last 3 Encounters:  12/16/17 88  03/07/17 78  09/26/16 62    Physical Exam  Constitutional: She is oriented to person, place, and time. She appears well-nourished. No distress.  Eyes: Pupils are equal, round, and reactive to light. EOM are normal.  Cardiovascular: Normal rate, regular rhythm, S1 normal, S2 normal, normal heart sounds and intact distal pulses. Exam reveals no gallop, no friction rub and no decreased pulses.  No murmur heard. Pulmonary/Chest: Effort normal. No stridor. No respiratory distress. She has no wheezes.  She has no rales.  Abdominal: She exhibits no distension.  Musculoskeletal: She exhibits no edema.  Neurological: She is alert and oriented to person, place, and time. No cranial nerve deficit. Gait normal.  Skin: Skin is dry. She is not diaphoretic.  Psychiatric: She has a normal mood and affect.  Vitals reviewed.    No results found for this or any previous visit (from the past 72 hour(s)).  No results found.  ASSESSMENT AND PLAN:   Asymptomatic hypertension - Patient asymptomatic here with a normal physical exam.  We will start him on 5 mg of Norvasc she will see her primary care provider at this month    Discharge Instructions     Start Norvasc tonight.  It does not matter what time of day that you take it.  I will expect your pressure to be in the low 140s to 130s with this dose.  Please follow-up with your primary care provider so they may follow and adjust the medication as necessary.        The patient is advised to call or return to clinic if she does not see an improvement in symptoms, or to seek the care of the closest emergency department if she worsens with the above plan.   Deliah BostonMichael Elecia Serafin, MHS, PA-C 12/16/2017 8:20 PM   Christine Howell, Christine Speiser L, PA-C 12/16/17 2020

## 2017-12-16 NOTE — Discharge Instructions (Addendum)
Start Norvasc tonight.  It does not matter what time of day that you take it.  I will expect your pressure to be in the low 140s to 130s with this dose.  Please follow-up with your primary care provider so they may follow and adjust the medication as necessary.

## 2018-01-14 ENCOUNTER — Other Ambulatory Visit: Payer: Self-pay | Admitting: Physician Assistant

## 2018-11-07 DIAGNOSIS — E1101 Type 2 diabetes mellitus with hyperosmolarity with coma: Secondary | ICD-10-CM | POA: Diagnosis not present

## 2018-11-07 DIAGNOSIS — R7303 Prediabetes: Secondary | ICD-10-CM | POA: Diagnosis not present

## 2018-11-07 DIAGNOSIS — R635 Abnormal weight gain: Secondary | ICD-10-CM | POA: Diagnosis not present

## 2018-11-07 DIAGNOSIS — E782 Mixed hyperlipidemia: Secondary | ICD-10-CM | POA: Diagnosis not present

## 2018-11-07 DIAGNOSIS — I10 Essential (primary) hypertension: Secondary | ICD-10-CM | POA: Diagnosis not present

## 2018-11-07 DIAGNOSIS — J069 Acute upper respiratory infection, unspecified: Secondary | ICD-10-CM | POA: Diagnosis not present

## 2018-11-07 DIAGNOSIS — Z0000 Encounter for general adult medical examination without abnormal findings: Secondary | ICD-10-CM | POA: Diagnosis not present

## 2019-02-16 DIAGNOSIS — Z6841 Body Mass Index (BMI) 40.0 and over, adult: Secondary | ICD-10-CM | POA: Diagnosis not present

## 2019-02-16 DIAGNOSIS — J9801 Acute bronchospasm: Secondary | ICD-10-CM | POA: Diagnosis not present

## 2019-02-16 DIAGNOSIS — E1169 Type 2 diabetes mellitus with other specified complication: Secondary | ICD-10-CM | POA: Diagnosis not present

## 2019-02-16 DIAGNOSIS — E785 Hyperlipidemia, unspecified: Secondary | ICD-10-CM | POA: Diagnosis not present

## 2019-02-16 DIAGNOSIS — E669 Obesity, unspecified: Secondary | ICD-10-CM | POA: Diagnosis not present

## 2019-03-11 DIAGNOSIS — Z20828 Contact with and (suspected) exposure to other viral communicable diseases: Secondary | ICD-10-CM | POA: Diagnosis not present

## 2019-04-06 DIAGNOSIS — I10 Essential (primary) hypertension: Secondary | ICD-10-CM | POA: Diagnosis not present

## 2019-04-06 DIAGNOSIS — J069 Acute upper respiratory infection, unspecified: Secondary | ICD-10-CM | POA: Diagnosis not present

## 2019-04-06 DIAGNOSIS — E669 Obesity, unspecified: Secondary | ICD-10-CM | POA: Diagnosis not present

## 2019-04-06 DIAGNOSIS — E782 Mixed hyperlipidemia: Secondary | ICD-10-CM | POA: Diagnosis not present

## 2019-05-07 DIAGNOSIS — Z23 Encounter for immunization: Secondary | ICD-10-CM | POA: Diagnosis not present

## 2019-05-07 DIAGNOSIS — R070 Pain in throat: Secondary | ICD-10-CM | POA: Diagnosis not present

## 2019-07-26 DIAGNOSIS — Z20828 Contact with and (suspected) exposure to other viral communicable diseases: Secondary | ICD-10-CM | POA: Diagnosis not present

## 2019-10-03 DIAGNOSIS — Z20828 Contact with and (suspected) exposure to other viral communicable diseases: Secondary | ICD-10-CM | POA: Diagnosis not present

## 2020-02-05 DIAGNOSIS — Z20822 Contact with and (suspected) exposure to covid-19: Secondary | ICD-10-CM | POA: Diagnosis not present

## 2020-02-05 DIAGNOSIS — Z03818 Encounter for observation for suspected exposure to other biological agents ruled out: Secondary | ICD-10-CM | POA: Diagnosis not present

## 2020-02-26 DIAGNOSIS — Z20822 Contact with and (suspected) exposure to covid-19: Secondary | ICD-10-CM | POA: Diagnosis not present

## 2020-02-26 DIAGNOSIS — Z03818 Encounter for observation for suspected exposure to other biological agents ruled out: Secondary | ICD-10-CM | POA: Diagnosis not present

## 2020-03-14 DIAGNOSIS — E6609 Other obesity due to excess calories: Secondary | ICD-10-CM | POA: Diagnosis not present

## 2020-03-14 DIAGNOSIS — E1165 Type 2 diabetes mellitus with hyperglycemia: Secondary | ICD-10-CM | POA: Diagnosis not present

## 2020-03-14 DIAGNOSIS — E782 Mixed hyperlipidemia: Secondary | ICD-10-CM | POA: Diagnosis not present

## 2020-03-14 DIAGNOSIS — J9801 Acute bronchospasm: Secondary | ICD-10-CM | POA: Diagnosis not present

## 2020-03-21 DIAGNOSIS — S93491A Sprain of other ligament of right ankle, initial encounter: Secondary | ICD-10-CM | POA: Diagnosis not present

## 2020-03-27 DIAGNOSIS — R262 Difficulty in walking, not elsewhere classified: Secondary | ICD-10-CM | POA: Diagnosis not present

## 2020-03-27 DIAGNOSIS — M25571 Pain in right ankle and joints of right foot: Secondary | ICD-10-CM | POA: Diagnosis not present

## 2020-03-27 DIAGNOSIS — M25671 Stiffness of right ankle, not elsewhere classified: Secondary | ICD-10-CM | POA: Diagnosis not present

## 2020-03-27 DIAGNOSIS — M6281 Muscle weakness (generalized): Secondary | ICD-10-CM | POA: Diagnosis not present

## 2020-04-08 DIAGNOSIS — Z20822 Contact with and (suspected) exposure to covid-19: Secondary | ICD-10-CM | POA: Diagnosis not present

## 2020-04-10 DIAGNOSIS — M25571 Pain in right ankle and joints of right foot: Secondary | ICD-10-CM | POA: Diagnosis not present

## 2020-04-11 DIAGNOSIS — E1169 Type 2 diabetes mellitus with other specified complication: Secondary | ICD-10-CM | POA: Diagnosis not present

## 2020-04-11 DIAGNOSIS — E785 Hyperlipidemia, unspecified: Secondary | ICD-10-CM | POA: Diagnosis not present

## 2020-04-11 DIAGNOSIS — I10 Essential (primary) hypertension: Secondary | ICD-10-CM | POA: Diagnosis not present

## 2020-05-11 DIAGNOSIS — J9801 Acute bronchospasm: Secondary | ICD-10-CM | POA: Diagnosis not present

## 2020-05-11 DIAGNOSIS — E669 Obesity, unspecified: Secondary | ICD-10-CM | POA: Diagnosis not present

## 2020-05-11 DIAGNOSIS — I10 Essential (primary) hypertension: Secondary | ICD-10-CM | POA: Diagnosis not present

## 2020-05-11 DIAGNOSIS — E1169 Type 2 diabetes mellitus with other specified complication: Secondary | ICD-10-CM | POA: Diagnosis not present

## 2020-07-24 DIAGNOSIS — E1169 Type 2 diabetes mellitus with other specified complication: Secondary | ICD-10-CM | POA: Diagnosis not present

## 2020-07-24 DIAGNOSIS — I10 Essential (primary) hypertension: Secondary | ICD-10-CM | POA: Diagnosis not present

## 2020-07-24 DIAGNOSIS — E6609 Other obesity due to excess calories: Secondary | ICD-10-CM | POA: Diagnosis not present

## 2020-08-15 DIAGNOSIS — I10 Essential (primary) hypertension: Secondary | ICD-10-CM | POA: Diagnosis not present

## 2020-08-15 DIAGNOSIS — E1169 Type 2 diabetes mellitus with other specified complication: Secondary | ICD-10-CM | POA: Diagnosis not present

## 2020-08-15 DIAGNOSIS — L03313 Cellulitis of chest wall: Secondary | ICD-10-CM | POA: Diagnosis not present

## 2020-08-15 DIAGNOSIS — E6609 Other obesity due to excess calories: Secondary | ICD-10-CM | POA: Diagnosis not present

## 2020-08-25 DIAGNOSIS — L0291 Cutaneous abscess, unspecified: Secondary | ICD-10-CM | POA: Diagnosis not present

## 2020-08-25 DIAGNOSIS — L03313 Cellulitis of chest wall: Secondary | ICD-10-CM | POA: Diagnosis not present

## 2020-08-25 DIAGNOSIS — N611 Abscess of the breast and nipple: Secondary | ICD-10-CM | POA: Diagnosis not present

## 2020-10-27 DIAGNOSIS — Z20822 Contact with and (suspected) exposure to covid-19: Secondary | ICD-10-CM | POA: Diagnosis not present

## 2020-10-27 DIAGNOSIS — Z03818 Encounter for observation for suspected exposure to other biological agents ruled out: Secondary | ICD-10-CM | POA: Diagnosis not present

## 2020-11-01 DIAGNOSIS — E782 Mixed hyperlipidemia: Secondary | ICD-10-CM | POA: Diagnosis not present

## 2020-11-01 DIAGNOSIS — I10 Essential (primary) hypertension: Secondary | ICD-10-CM | POA: Diagnosis not present

## 2020-11-01 DIAGNOSIS — E1169 Type 2 diabetes mellitus with other specified complication: Secondary | ICD-10-CM | POA: Diagnosis not present

## 2020-11-01 DIAGNOSIS — E669 Obesity, unspecified: Secondary | ICD-10-CM | POA: Diagnosis not present

## 2020-11-01 DIAGNOSIS — R635 Abnormal weight gain: Secondary | ICD-10-CM | POA: Diagnosis not present

## 2020-12-08 DIAGNOSIS — Z20828 Contact with and (suspected) exposure to other viral communicable diseases: Secondary | ICD-10-CM | POA: Diagnosis not present

## 2020-12-08 DIAGNOSIS — J028 Acute pharyngitis due to other specified organisms: Secondary | ICD-10-CM | POA: Diagnosis not present

## 2020-12-29 DIAGNOSIS — Z20822 Contact with and (suspected) exposure to covid-19: Secondary | ICD-10-CM | POA: Diagnosis not present

## 2021-01-16 DIAGNOSIS — E669 Obesity, unspecified: Secondary | ICD-10-CM | POA: Diagnosis not present

## 2021-01-16 DIAGNOSIS — I10 Essential (primary) hypertension: Secondary | ICD-10-CM | POA: Diagnosis not present

## 2021-01-16 DIAGNOSIS — E785 Hyperlipidemia, unspecified: Secondary | ICD-10-CM | POA: Diagnosis not present

## 2021-01-16 DIAGNOSIS — E1169 Type 2 diabetes mellitus with other specified complication: Secondary | ICD-10-CM | POA: Diagnosis not present

## 2021-02-15 DIAGNOSIS — I10 Essential (primary) hypertension: Secondary | ICD-10-CM | POA: Diagnosis not present

## 2021-02-15 DIAGNOSIS — E782 Mixed hyperlipidemia: Secondary | ICD-10-CM | POA: Diagnosis not present

## 2021-02-15 DIAGNOSIS — Z6841 Body Mass Index (BMI) 40.0 and over, adult: Secondary | ICD-10-CM | POA: Diagnosis not present

## 2021-02-15 DIAGNOSIS — E1169 Type 2 diabetes mellitus with other specified complication: Secondary | ICD-10-CM | POA: Diagnosis not present

## 2021-02-15 DIAGNOSIS — E785 Hyperlipidemia, unspecified: Secondary | ICD-10-CM | POA: Diagnosis not present

## 2021-04-08 NOTE — Progress Notes (Addendum)
New Patient Note  RE: Christine Howell MRN: [id number] DOB: 1966-06-16 Date of Office Visit: 04/09/2021  Consult requested by: Renaye Rakers, MD Primary care provider: Renaye Rakers, MD  Chief Complaint: Establish Care (Patient in today for allergy testing per her PCP request. )  History of Present Illness: I had the pleasure of seeing Christine Howell for initial evaluation at the [facility] on 04/10/2021. She is a 55 y.o. female, who is referred here by Renaye Rakers, MD for the evaluation of allergies.  Patient states that she feels like her neck glands on the left feel swollen for many years but worsening the past 1 year. No prior evaluation by ENT or dentist.  She reports symptoms of sneezing, slight rhinorrhea/PND. Symptoms have been going on for many years. The symptoms are present  all year around. Other triggers include exposure to pet dander - used to be worse in [place]. Anosmia: yes since Covid-19. Headache: no. She has used unknown medications over 30 years ago in [place].   Sinus infections: none. Previous work up includes: skin testing over 30 years ago which showed multiple positives per patient report. No prior AIT Previous ENT evaluation: no. Previous sinus imaging: no. History of nasal polyps: no. Last eye exam: this year. History of reflux: only with certain foods.  Labs from 12/08/2020 eos 237 - see scanned results.  Assessment and Plan: Nataliee is a 55 y.o. female with: Wheezing History of asthma symptoms around pet dander but recently noted some wheezing and difficulty breathing since she had COVID-19 1 year ago.  No recent albuterol use. Today's spirometry showed some restriction with no improvement in FEV1 postbronchodilator treatment.  Clinically feeling slightly improved. Start prednisone taper - reviewed side effects. Daily controller medication(s): START Breo 1 puff once a day and rinse mouth after each use. Sample given  and demonstrated proper use.  May use albuterol rescue inhaler 2 puffs every 4 to 6 hours as needed for shortness of breath, chest tightness, coughing, and wheezing. May use albuterol rescue inhaler 2 puffs 5 to 15 minutes prior to strenuous physical activities. Monitor frequency of use.  Get spirometry at next visit.  Other allergic rhinitis Perennial rhinitis symptoms for many years with main trigger being pet dander.  Last year noticed enlarged glands on the left side of her neck and concerned about allergies.  No prior evaluation. Unable to skin test today due to breathing status. Use over the counter antihistamines such as Zyrtec (cetirizine), Claritin (loratadine), Allegra (fexofenadine), or Xyzal (levocetirizine) daily as needed. May take twice a day during allergy flares. May switch antihistamines every few months. Use Flonase (fluticasone) nasal spray 1 spray per nostril twice a day as needed for nasal congestion.  If skin testing unremarkable then will refer to ENT next.   Return in about 4 weeks (around 05/07/2021) for Skin testing.  Meds ordered this encounter  Medications   albuterol (PROAIR HFA) 108 (90 Base) MCG/ACT inhaler    Sig: Inhale 2 puffs into the lungs every 4 (four) hours as needed for wheezing or shortness of breath (coughing fits).    Dispense:  18 g    Refill:  1   fluticasone furoate-vilanterol (BREO ELLIPTA) 100-25 MCG/INH AEPB    Sig: Inhale 1 puff into the lungs daily. Rinse mouth after each use.    Dispense:  60 each    Refill:  3   fluticasone (FLONASE) 50 MCG/ACT nasal spray    Sig:  Place 1 spray into both nostrils 2 (two) times daily as needed (nasal congestion).    Dispense:  16 g    Refill:  5    Lab Orders  No laboratory test(s) ordered today    Other allergy screening: Asthma: yes Animals trigger her asthma with chest tightness. Patient has an albuterol inhaler. No recent use but have been having rattling, wheezing and some difficulty  breathing the past year since had Covid-19.   Food allergy: no Medication allergy: no Hymenoptera allergy: no Urticaria: no Eczema: on the hands History of recurrent infections suggestive of immunodeficency: no  Diagnostics: Spirometry:  Tracings reviewed. Her effort: Good reproducible efforts. FVC: 1.95L FEV1: 1.79L, 70% predicted FEV1/FVC ratio: 92% Interpretation: Spirometry consistent with possible restrictive disease with no improvement in FEV1 post bronchodilator treatment. Clinically feeling slightly improved.   Please see scanned spirometry results for details.  Skin Testing: Deferred due to wheezing on exam.  Past Medical History: Patient Active Problem List   Diagnosis Date Noted   Wheezing 04/10/2021   Other allergic rhinitis 04/10/2021   Other atopic dermatitis 04/10/2021   HAV (hallux abducto valgus) 02/23/2016   Past Medical History:  Diagnosis Date   Obesity    Past Surgical History: History reviewed. No pertinent surgical history. Medication List:  Current Outpatient Medications  Medication Sig Dispense Refill   albuterol (PROAIR HFA) 108 (90 Base) MCG/ACT inhaler Inhale 2 puffs into the lungs every 4 (four) hours as needed for wheezing or shortness of breath (coughing fits). 18 g 1   amLODipine (NORVASC) 5 MG tablet Take 1 tablet (5 mg total) by mouth daily. 30 tablet 0   fluticasone (FLONASE) 50 MCG/ACT nasal spray Place 1 spray into both nostrils 2 (two) times daily as needed (nasal congestion). 16 g 5   fluticasone furoate-vilanterol (BREO ELLIPTA) 100-25 MCG/INH AEPB Inhale 1 puff into the lungs daily. Rinse mouth after each use. 60 each 3   rosuvastatin (CRESTOR) 20 MG tablet Take 20 mg by mouth daily.     No current facility-administered medications for this visit.   Allergies: Allergies  Allergen Reactions   Seasonal Ic [Cholestatin]    Social History: Social History   Socioeconomic History   Marital status: Married    Spouse name: Not  on file   Number of children: Not on file   Years of education: Not on file   Highest education level: Not on file  Occupational History   Not on file  Tobacco Use   Smoking status: Never   Smokeless tobacco: Never  Substance and Sexual Activity   Alcohol use: No   Drug use: No   Sexual activity: Not on file  Other Topics Concern   Not on file  Social History Narrative   Not on file   Social Determinants of Health   Financial Resource Strain: Not on file  Food Insecurity: Not on file  Transportation Needs: Not on file  Physical Activity: Not on file  Stress: Not on file  Social Connections: Not on file   Lives in a 56 year old house. Smoking: denies Occupation: Systems developer, Air traffic controller History: Water Damage/mildew in the house: yes Carpet in the family room: no Carpet in the bedroom: yes Heating: gas Cooling: central Pet: no  Family History: History reviewed. No pertinent family history. Problem  Relation Asthma                                   No  Eczema                                No  Food allergy                          No  Allergic rhino conjunctivitis     sister?   Review of Systems  Constitutional:  Negative for appetite change, chills, fever and unexpected weight change.  HENT:  Negative for congestion and rhinorrhea.   Eyes:  Negative for itching.  Respiratory:  Positive for cough, chest tightness, shortness of breath and wheezing.   Cardiovascular:  Negative for chest pain.  Gastrointestinal:  Negative for abdominal pain.  Genitourinary:  Negative for difficulty urinating.  Skin:  Negative for rash.  Neurological:  Negative for headaches.   Objective: BP 124/78   Pulse 84   Temp 98.3 F (36.8 C) (Temporal)   Resp 16   Ht 5\' 8"  (1.727 m)   Wt 280 lb 6.4 oz (127.2 kg)   SpO2 95%   BMI 42.63 kg/m  Body mass index is 42.63 kg/m. Physical Exam Vitals and nursing note reviewed.  Constitutional:       Appearance: Normal appearance. She is well-developed.  HENT:     Head: Normocephalic and atraumatic.     Comments: Palpable submandibular glands b/l - nontender.    Right Ear: Tympanic membrane and external ear normal.     Left Ear: Tympanic membrane and external ear normal.     Nose: Nose normal.     Mouth/Throat:     Mouth: Mucous membranes are moist.     Pharynx: Oropharynx is clear.  Eyes:     Conjunctiva/sclera: Conjunctivae normal.  Cardiovascular:     Rate and Rhythm: Normal rate and regular rhythm.     Heart sounds: Normal heart sounds. No murmur heard.   No friction rub. No gallop.  Pulmonary:     Effort: Pulmonary effort is normal.     Breath sounds: Wheezing present. No rhonchi or rales.  Musculoskeletal:     Cervical back: Neck supple.  Skin:    General: Skin is warm.     Findings: No rash.  Neurological:     Mental Status: She is alert and oriented to person, place, and time.  Psychiatric:        Behavior: Behavior normal.   The plan was reviewed with the patient/family, and all questions/concerned were addressed.  It was my pleasure to see Kelle today and participate in her care. Please feel free to contact me with any questions or concerns.  Sincerely,  Selena Batten, DO Allergy & Immunology  [facility] office: [phone] [facility] office: [phone]

## 2021-04-09 ENCOUNTER — Ambulatory Visit (INDEPENDENT_AMBULATORY_CARE_PROVIDER_SITE_OTHER): Payer: BC Managed Care – PPO | Admitting: Allergy

## 2021-04-09 ENCOUNTER — Encounter: Payer: Self-pay | Admitting: Allergy

## 2021-04-09 ENCOUNTER — Other Ambulatory Visit: Payer: Self-pay

## 2021-04-09 VITALS — BP 124/78 | HR 84 | Temp 98.3°F | Resp 16 | Ht 68.0 in | Wt 280.4 lb

## 2021-04-09 DIAGNOSIS — R062 Wheezing: Secondary | ICD-10-CM | POA: Diagnosis not present

## 2021-04-09 DIAGNOSIS — J3089 Other allergic rhinitis: Secondary | ICD-10-CM | POA: Diagnosis not present

## 2021-04-09 DIAGNOSIS — L2089 Other atopic dermatitis: Secondary | ICD-10-CM

## 2021-04-09 MED ORDER — FLUTICASONE FUROATE-VILANTEROL 100-25 MCG/INH IN AEPB: 1.0000 | INHALATION_SPRAY | Freq: Every day | RESPIRATORY_TRACT | 3 refills | Status: DC

## 2021-04-09 MED ORDER — FLUTICASONE PROPIONATE 50 MCG/ACT NA SUSP: 1.0000 | Freq: Two times a day (BID) | NASAL | 5 refills | Status: DC | PRN

## 2021-04-09 MED ORDER — ALBUTEROL SULFATE HFA 108 (90 BASE) MCG/ACT IN AERS: 2.0000 | INHALATION_SPRAY | RESPIRATORY_TRACT | 1 refills | Status: DC | PRN

## 2021-04-09 NOTE — Patient Instructions (Signed)
  Breathing:  Prednisone 10mg  tablet pack: 2 tablets given in office today. Take 2 more tablets before bed today.  Then take 2 tablets twice a day for 2 more days. Then take 2 tablets once a day for 1 day. Then take 1 tablet once a day for 1 day.   Daily controller medication(s): START Breo 1 puff once a day and rinse mouth after each use. May use albuterol rescue inhaler 2 puffs every 4 to 6 hours as needed for shortness of breath, chest tightness, coughing, and wheezing. May use albuterol rescue inhaler 2 puffs 5 to 15 minutes prior to strenuous physical activities. Monitor frequency of use.  Asthma control goals:  Full participation in all desired activities (may need albuterol before activity) Albuterol use two times or less a week on average (not counting use with activity) Cough interfering with sleep two times or less a month Oral steroids no more than once a year No hospitalizations   Rhinitis:  Unable to skin test today due to breathing status. Use over the counter antihistamines such as Zyrtec (cetirizine), Claritin (loratadine), Allegra (fexofenadine), or Xyzal (levocetirizine) daily as needed. May take twice a day during allergy flares. May switch antihistamines every few months. Use Flonase (fluticasone) nasal spray 1 spray per nostril twice a day as needed for nasal congestion.   Return in 1 month for skin testing and follow up. No antihistamines for 3 days before.

## 2021-04-10 DIAGNOSIS — J302 Other seasonal allergic rhinitis: Secondary | ICD-10-CM | POA: Insufficient documentation

## 2021-04-10 DIAGNOSIS — R062 Wheezing: Secondary | ICD-10-CM | POA: Insufficient documentation

## 2021-04-10 DIAGNOSIS — J3089 Other allergic rhinitis: Secondary | ICD-10-CM | POA: Insufficient documentation

## 2021-04-10 DIAGNOSIS — L2089 Other atopic dermatitis: Secondary | ICD-10-CM | POA: Insufficient documentation

## 2021-04-10 NOTE — Assessment & Plan Note (Signed)
History of asthma symptoms around pet dander but recently noted some wheezing and difficulty breathing since she had COVID-19 1 year ago.  No recent albuterol use.  Today's spirometry showed some restriction with no improvement in FEV1 postbronchodilator treatment.  Clinically feeling slightly improved.  Start prednisone taper - reviewed side effects. . Daily controller medication(s): START Breo 1 puff once a day and rinse mouth after each use. Sample given and demonstrated proper use.  . May use albuterol rescue inhaler 2 puffs every 4 to 6 hours as needed for shortness of breath, chest tightness, coughing, and wheezing. May use albuterol rescue inhaler 2 puffs 5 to 15 minutes prior to strenuous physical activities. Monitor frequency of use.  . Get spirometry at next visit.

## 2021-04-10 NOTE — Assessment & Plan Note (Addendum)
Perennial rhinitis symptoms for many years with main trigger being pet dander.  Last year noticed enlarged glands on the left side of her neck and concerned about allergies.  No prior evaluation. . Unable to skin test today due to breathing status. . Use over the counter antihistamines such as Zyrtec (cetirizine), Claritin (loratadine), Allegra (fexofenadine), or Xyzal (levocetirizine) daily as needed. May take twice a day during allergy flares. May switch antihistamines every few months. . Use Flonase (fluticasone) nasal spray 1 spray per nostril twice a day as needed for nasal congestion.  . If skin testing unremarkable then will refer to ENT next.

## 2021-05-04 ENCOUNTER — Encounter: Payer: Self-pay | Admitting: *Deleted

## 2021-05-12 NOTE — Patient Instructions (Addendum)
wheezing Daily controller medication(s): Continue Breo 1 puff once a day and rinse mouth after each use. May use albuterol rescue inhaler 2 puffs every 4 to 6 hours as needed for shortness of breath, chest tightness, coughing, and wheezing. May use albuterol rescue inhaler 2 puffs 5 to 15 minutes prior to strenuous physical activities. Monitor frequency of use.  Asthma control goals:  Full participation in all desired activities (may need albuterol before activity) Albuterol use two times or less a week on average (not counting use with activity) Cough interfering with sleep two times or less a month Oral steroids no more than once a year No hospitalizations   Seasonal and perennial allergic rhinitis Use over the counter antihistamines such as Zyrtec (cetirizine), Claritin (loratadine), Allegra (fexofenadine), or Xyzal (levocetirizine) daily as needed. May take twice a day during allergy flares. May switch antihistamines every few months. Use Flonase (fluticasone) nasal spray 1 spray per nostril twice a day as needed for nasal congestion.  Start azelastine 1-2 sprays each nostril twice a day as needed for runny nose/drainage down throat Consider allergy injections if the medications above are not controlling your symptoms  Schedule a follow up appointment in 2 months or sooner if needed.   Control of Dog or Cat Allergen Avoidance is the best way to manage a dog or cat allergy. If you have a dog or cat and are allergic to dog or cats, consider removing the dog or cat from the home. If you have a dog or cat but don't want to find it a new home, or if your family wants a pet even though someone in the household is allergic, here are some strategies that may help keep symptoms at bay:  Keep the pet out of your bedroom and restrict it to only a few rooms. Be advised that keeping the dog or cat in only one room will not limit the allergens to that room. Don't pet, hug or kiss the dog or cat;  if you do, wash your hands with soap and water. High-efficiency particulate air (HEPA) cleaners run continuously in a bedroom or living room can reduce allergen levels over time. Regular use of a high-efficiency vacuum cleaner or a central vacuum can reduce allergen levels. Giving your dog or cat a bath at least once a week can reduce airborne allergen.  Reducing Pollen Exposure The American Academy of Allergy, Asthma and Immunology suggests the following steps to reduce your exposure to pollen during allergy seasons. Do not hang sheets or clothing out to dry; pollen may collect on these items. Do not mow lawns or spend time around freshly cut grass; mowing stirs up pollen. Keep windows closed at night.  Keep car windows closed while driving. Minimize morning activities outdoors, a time when pollen counts are usually at their highest. Stay indoors as much as possible when pollen counts or humidity is high and on windy days when pollen tends to remain in the air longer. Use air conditioning when possible.  Many air conditioners have filters that trap the pollen spores. Use a HEPA room air filter to remove pollen form the indoor air you breathe.  Control of Cockroach Allergen  Cockroach allergen has been identified as an important cause of acute attacks of asthma, especially in urban settings.  There are fifty-five species of cockroach that exist in the [place], however only three, the [place], [place] species produce allergen that can affect patients with Asthma.  Allergens can be obtained from  fecal particles, egg casings and secretions from cockroaches.    Remove food sources. Reduce access to water. Seal access and entry points. Spray runways with 0.5-1% Diazinon or Chlorpyrifos Blow boric acid power under stoves and refrigerator. Place bait stations (hydramethylnon) at feeding sites.  Control of Dust Mite Allergen Dust mites play a major role in allergic asthma and  rhinitis. They occur in environments with high humidity wherever human skin is found. Dust mites absorb humidity from the atmosphere (ie, they do not drink) and feed on organic matter (including shed human and animal skin). Dust mites are a microscopic type of insect that you cannot see with the naked eye. High levels of dust mites have been detected from mattresses, pillows, carpets, upholstered furniture, bed covers, clothes, soft toys and any woven material. The principal allergen of the dust mite is found in its feces. A gram of dust may contain 1,000 mites and 250,000 fecal particles. Mite antigen is easily measured in the air during house cleaning activities. Dust mites do not bite and do not cause harm to humans, other than by triggering allergies/asthma.  Ways to decrease your exposure to dust mites in your home:  1. Encase mattresses, box springs and pillows with a mite-impermeable barrier or cover  2. Wash sheets, blankets and drapes weekly in hot water (130 F) with detergent and dry them in a dryer on the hot setting.  3. Have the room cleaned frequently with a vacuum cleaner and a damp dust-mop. For carpeting or rugs, vacuuming with a vacuum cleaner equipped with a high-efficiency particulate air (HEPA) filter. The dust mite allergic individual should not be in a room which is being cleaned and should wait 1 hour after cleaning before going into the room.  4. Do not sleep on upholstered furniture (eg, couches).  5. If possible removing carpeting, upholstered furniture and drapery from the home is ideal. Horizontal blinds should be eliminated in the rooms where the person spends the most time (bedroom, study, television room). Washable vinyl, roller-type shades are optimal.  6. Remove all non-washable stuffed toys from the bedroom. Wash stuffed toys weekly like sheets and blankets above.  7. Reduce indoor humidity to less than 50%. Inexpensive humidity monitors can be purchased at most  hardware stores. Do not use a humidifier as can make the problem worse and are not recommended.  Control of Mold Allergen Mold and fungi can grow on a variety of surfaces provided certain temperature and moisture conditions exist.  Outdoor molds grow on plants, decaying vegetation and soil.  The major outdoor mold, Alternaria and Cladosporium, are found in very high numbers during hot and dry conditions.  Generally, a late Summer - Fall peak is seen for common outdoor fungal spores.  Rain will temporarily lower outdoor mold spore count, but counts rise rapidly when the rainy period ends.  The most important indoor molds are Aspergillus and Penicillium.  Dark, humid and poorly ventilated basements are ideal sites for mold growth.  The next most common sites of mold growth are the bathroom and the kitchen.  Outdoor [employer] Use air conditioning and keep windows closed Avoid exposure to decaying vegetation. Avoid leaf raking. Avoid grain handling. Consider wearing a face mask if working in moldy areas.  Indoor Mold Control Maintain humidity below 50%. Clean washable surfaces with 5% bleach solution. Remove sources e.g. Contaminated carpets.

## 2021-05-14 ENCOUNTER — Other Ambulatory Visit: Payer: Self-pay

## 2021-05-14 ENCOUNTER — Ambulatory Visit (INDEPENDENT_AMBULATORY_CARE_PROVIDER_SITE_OTHER): Payer: BC Managed Care – PPO | Admitting: Family

## 2021-05-14 ENCOUNTER — Encounter: Payer: Self-pay | Admitting: Family

## 2021-05-14 VITALS — BP 118/78 | HR 74 | Temp 97.9°F | Resp 16 | Ht 68.0 in | Wt 276.8 lb

## 2021-05-14 DIAGNOSIS — J302 Other seasonal allergic rhinitis: Secondary | ICD-10-CM

## 2021-05-14 DIAGNOSIS — J3089 Other allergic rhinitis: Secondary | ICD-10-CM | POA: Diagnosis not present

## 2021-05-14 DIAGNOSIS — R062 Wheezing: Secondary | ICD-10-CM

## 2021-05-14 MED ORDER — AZELASTINE HCL 0.1 % NA SOLN: NASAL | 3 refills | Status: DC

## 2021-05-14 NOTE — Progress Notes (Signed)
[address] Debbora Presto [place] [address] Dept: [phone]  FOLLOW UP NOTE  Patient ID: Christine Howell, female    DOB: [birth year]  Age: 55 y.o. MRN: [id number] Date of Office Visit: 05/14/2021  Assessment  Chief Complaint: Allergy Testing  HPI Christine Howell is a 55 year old female who presents today for skin testing to environmental allergens.  She was last seen on April 09, 2021 by Dr. Selena Batten for wheezing and allergic rhinitis.  Wheezing is reported as a lot better since starting Breo 100 mcg 1 puff once a day and albuterol as needed.  She denies coughing, wheezing, tightness in her chest, shortness of breath, and nocturnal awakenings due to breathing problems.  Since her last office visit she has not required any systemic steroids or made any trips to the emergency room or urgent care due to breathing problems.  She has not had to use her albuterol inhaler since we last saw her.  Allergic rhinitis is reported as moderately controlled with no medication at this time.  She does not currently take an antihistamine and has not been using fluticasone nasal spray.  She reports postnasal drip and denies rhinorrhea and nasal congestion.  She mentions in the past when she was skin tested to environmental allergens that she was allergic to everything and has not ever had allergy injections.  She mentions that her allergy symptoms did not bother her bad enough for allergy injections.   Drug Allergies:  Allergies  Allergen Reactions   Seasonal Ic [Cholestatin]     Review of Systems: Review of Systems  Constitutional:  Negative for chills and fever.  HENT:         Reports postnasal drip and denies rhinorrhea and nasal congestion  Eyes:        Denies itchy watery eyes  Respiratory:  Negative for cough, shortness of breath and wheezing.        Denies cough, wheeze, tightness in chest, shortness of breath, and nocturnal awakenings due to breathing problems  Cardiovascular:   Negative for chest pain and palpitations.  Gastrointestinal:  Positive for heartburn.       Reports heartburn and denies reflux.  She reports that she will drink ginger.  She does not wish to start medication because her symptoms are nothing serious.  Genitourinary:  Negative for frequency.  Skin:  Negative for itching and rash.  Neurological:  Negative for headaches.    Physical Exam: BP 118/78   Pulse 74   Temp 97.9 F (36.6 C)   Resp 16   Ht 5\' 8"  (1.727 m)   Wt 276 lb 12.8 oz (125.6 kg)   SpO2 97%   BMI 42.09 kg/m    Physical Exam Constitutional:      Appearance: Normal appearance.  HENT:     Head: Normocephalic and atraumatic.     Comments: Pharynx: Mild cobblestoning noted, eyes normal, ears normal, nose: Bilateral lower turbinates moderately edematous and slightly erythematous with no drainage noted    Right Ear: Tympanic membrane, ear canal and external ear normal.     Left Ear: Tympanic membrane, ear canal and external ear normal.     Mouth/Throat:     Mouth: Mucous membranes are moist.     Pharynx: Oropharynx is clear.  Eyes:     Conjunctiva/sclera: Conjunctivae normal.  Cardiovascular:     Rate and Rhythm: Regular rhythm.     Heart sounds: Normal heart sounds.  Pulmonary:     Effort: Pulmonary effort is normal.  Breath sounds: Normal breath sounds.     Comments: Lungs clear to auscultation Musculoskeletal:     Cervical back: Neck supple.  Skin:    General: Skin is warm.  Neurological:     Mental Status: She is alert and oriented to person, place, and time.  Psychiatric:        Mood and Affect: Mood normal.        Behavior: Behavior normal.        Thought Content: Thought content normal.        Judgment: Judgment normal.    Diagnostics: FVC 2.23 L, FEV1 1.96 L (77%).  Predicted FVC 3.22 L, predicted FEV1 2.55 L.  Spirometry indicates possible mild restriction   Percutaneous skin testing today was positive to grass pollen, tree pollen, ragweed, weed  pollen, cat, dog, and horse.  Intradermal's are positive to [place] grass, mold pollen, [place] cockroach, and mite mix.  Assessment and Plan: 1. Wheezing   2. Seasonal and perennial allergic rhinitis     Meds ordered this encounter  Medications   azelastine (ASTELIN) 0.1 % nasal spray    Sig: Use 1 to 2 sprays each nostril twice a day as needed for runny nose/drainage down throat    Dispense:  30 mL    Refill:  3     Patient Instructions  wheezing Daily controller medication(s): Continue Breo 1 puff once a day and rinse mouth after each use. May use albuterol rescue inhaler 2 puffs every 4 to 6 hours as needed for shortness of breath, chest tightness, coughing, and wheezing. May use albuterol rescue inhaler 2 puffs 5 to 15 minutes prior to strenuous physical activities. Monitor frequency of use.  Asthma control goals:  Full participation in all desired activities (may need albuterol before activity) Albuterol use two times or less a week on average (not counting use with activity) Cough interfering with sleep two times or less a month Oral steroids no more than once a year No hospitalizations   Seasonal and perennial allergic rhinitis Use over the counter antihistamines such as Zyrtec (cetirizine), Claritin (loratadine), Allegra (fexofenadine), or Xyzal (levocetirizine) daily as needed. May take twice a day during allergy flares. May switch antihistamines every few months. Use Flonase (fluticasone) nasal spray 1 spray per nostril twice a day as needed for nasal congestion.  Start azelastine 1-2 sprays each nostril twice a day as needed for runny nose/drainage down throat Consider allergy injections if the medications above are not controlling your symptoms  Schedule a follow up appointment in 2 months or sooner if needed.   Control of Dog or Cat Allergen Avoidance is the best way to manage a dog or cat allergy. If you have a dog or cat and are allergic to dog or cats,  consider removing the dog or cat from the home. If you have a dog or cat but don't want to find it a new home, or if your family wants a pet even though someone in the household is allergic, here are some strategies that may help keep symptoms at bay:  Keep the pet out of your bedroom and restrict it to only a few rooms. Be advised that keeping the dog or cat in only one room will not limit the allergens to that room. Don't pet, hug or kiss the dog or cat; if you do, wash your hands with soap and water. High-efficiency particulate air (HEPA) cleaners run continuously in a bedroom or living room can reduce allergen levels over  time. Regular use of a high-efficiency vacuum cleaner or a central vacuum can reduce allergen levels. Giving your dog or cat a bath at least once a week can reduce airborne allergen.  Reducing Pollen Exposure The American Academy of Allergy, Asthma and Immunology suggests the following steps to reduce your exposure to pollen during allergy seasons. Do not hang sheets or clothing out to dry; pollen may collect on these items. Do not mow lawns or spend time around freshly cut grass; mowing stirs up pollen. Keep windows closed at night.  Keep car windows closed while driving. Minimize morning activities outdoors, a time when pollen counts are usually at their highest. Stay indoors as much as possible when pollen counts or humidity is high and on windy days when pollen tends to remain in the air longer. Use air conditioning when possible.  Many air conditioners have filters that trap the pollen spores. Use a HEPA room air filter to remove pollen form the indoor air you breathe.  Control of Cockroach Allergen  Cockroach allergen has been identified as an important cause of acute attacks of asthma, especially in urban settings.  There are fifty-five species of cockroach that exist in the [place], however only three, the [place], [place] species produce allergen  that can affect patients with Asthma.  Allergens can be obtained from fecal particles, egg casings and secretions from cockroaches.    Remove food sources. Reduce access to water. Seal access and entry points. Spray runways with 0.5-1% Diazinon or Chlorpyrifos Blow boric acid power under stoves and refrigerator. Place bait stations (hydramethylnon) at feeding sites.  Control of Dust Mite Allergen Dust mites play a major role in allergic asthma and rhinitis. They occur in environments with high humidity wherever human skin is found. Dust mites absorb humidity from the atmosphere (ie, they do not drink) and feed on organic matter (including shed human and animal skin). Dust mites are a microscopic type of insect that you cannot see with the naked eye. High levels of dust mites have been detected from mattresses, pillows, carpets, upholstered furniture, bed covers, clothes, soft toys and any woven material. The principal allergen of the dust mite is found in its feces. A gram of dust may contain 1,000 mites and 250,000 fecal particles. Mite antigen is easily measured in the air during house cleaning activities. Dust mites do not bite and do not cause harm to humans, other than by triggering allergies/asthma.  Ways to decrease your exposure to dust mites in your home:  1. Encase mattresses, box springs and pillows with a mite-impermeable barrier or cover  2. Wash sheets, blankets and drapes weekly in hot water (130 F) with detergent and dry them in a dryer on the hot setting.  3. Have the room cleaned frequently with a vacuum cleaner and a damp dust-mop. For carpeting or rugs, vacuuming with a vacuum cleaner equipped with a high-efficiency particulate air (HEPA) filter. The dust mite allergic individual should not be in a room which is being cleaned and should wait 1 hour after cleaning before going into the room.  4. Do not sleep on upholstered furniture (eg, couches).  5. If possible removing  carpeting, upholstered furniture and drapery from the home is ideal. Horizontal blinds should be eliminated in the rooms where the person spends the most time (bedroom, study, television room). Washable vinyl, roller-type shades are optimal.  6. Remove all non-washable stuffed toys from the bedroom. Wash stuffed toys weekly like sheets and blankets above.  7. Reduce indoor humidity to less than 50%. Inexpensive humidity monitors can be purchased at most hardware stores. Do not use a humidifier as can make the problem worse and are not recommended.  Control of Mold Allergen Mold and fungi can grow on a variety of surfaces provided certain temperature and moisture conditions exist.  Outdoor molds grow on plants, decaying vegetation and soil.  The major outdoor mold, Alternaria and Cladosporium, are found in very high numbers during hot and dry conditions.  Generally, a late Summer - Fall peak is seen for common outdoor fungal spores.  Rain will temporarily lower outdoor mold spore count, but counts rise rapidly when the rainy period ends.  The most important indoor molds are Aspergillus and Penicillium.  Dark, humid and poorly ventilated basements are ideal sites for mold growth.  The next most common sites of mold growth are the bathroom and the kitchen.  Outdoor [employer] Use air conditioning and keep windows closed Avoid exposure to decaying vegetation. Avoid leaf raking. Avoid grain handling. Consider wearing a face mask if working in moldy areas.  Indoor Mold Control Maintain humidity below 50%. Clean washable surfaces with 5% bleach solution. Remove sources e.g. Contaminated carpets.    Return in about 3 months (around 08/14/2021), or if symptoms worsen or fail to improve.    Thank you for the opportunity to care for this patient.  Please do not hesitate to contact me with questions.  Nehemiah Settle, FNP [facility]

## 2021-06-18 DIAGNOSIS — E1169 Type 2 diabetes mellitus with other specified complication: Secondary | ICD-10-CM | POA: Diagnosis not present

## 2021-06-18 DIAGNOSIS — I10 Essential (primary) hypertension: Secondary | ICD-10-CM | POA: Diagnosis not present

## 2021-08-15 ENCOUNTER — Ambulatory Visit (INDEPENDENT_AMBULATORY_CARE_PROVIDER_SITE_OTHER): Payer: BC Managed Care – PPO | Admitting: Allergy

## 2021-08-15 ENCOUNTER — Other Ambulatory Visit: Payer: Self-pay

## 2021-08-15 ENCOUNTER — Encounter: Payer: Self-pay | Admitting: Allergy

## 2021-08-15 VITALS — BP 130/82 | HR 73 | Temp 97.9°F | Resp 18 | Ht 68.0 in | Wt 278.5 lb

## 2021-08-15 DIAGNOSIS — J3089 Other allergic rhinitis: Secondary | ICD-10-CM | POA: Diagnosis not present

## 2021-08-15 DIAGNOSIS — J4540 Moderate persistent asthma, uncomplicated: Secondary | ICD-10-CM | POA: Insufficient documentation

## 2021-08-15 DIAGNOSIS — J302 Other seasonal allergic rhinitis: Secondary | ICD-10-CM

## 2021-08-15 MED ORDER — FLUTICASONE FUROATE-VILANTEROL 100-25 MCG/ACT IN AEPB: 1.0000 | INHALATION_SPRAY | Freq: Every day | RESPIRATORY_TRACT | 5 refills | Status: DC

## 2021-08-15 NOTE — Assessment & Plan Note (Addendum)
Past history - asthma symptoms around pet dander but recently noted some wheezing and difficulty breathing since she had COVID-19 1 year ago.  2022 spirometry showed some restriction with no improvement in FEV1 postbronchodilator treatment.  Clinically feeling slightly improved. Interim history - doing well with Breo and no more wheezing. Had URI a few weeks ago and used albuterol then.   Today's spirometry showed some restriction - improved from previous one.   Daily controller medication(s): Breo 158mcg 1 puff once a day and rinse mouth after each use.   May use albuterol rescue inhaler 2 puffs every 4 to 6 hours as needed for shortness of breath, chest tightness, coughing, and wheezing. May use albuterol rescue inhaler 2 puffs 5 to 15 minutes prior to strenuous physical activities. Monitor frequency of use.   Get spirometry at next visit.

## 2021-08-15 NOTE — Patient Instructions (Addendum)
Breathing:  Daily controller medication(s): continue Breo 1 puff once a day and rinse mouth after each use. May use albuterol rescue inhaler 2 puffs every 4 to 6 hours as needed for shortness of breath, chest tightness, coughing, and wheezing. May use albuterol rescue inhaler 2 puffs 5 to 15 minutes prior to strenuous physical activities. Monitor frequency of use.  Asthma control goals:  Full participation in all desired activities (may need albuterol before activity) Albuterol use two times or less a week on average (not counting use with activity) Cough interfering with sleep two times or less a month Oral steroids no more than once a year No hospitalizations   Environmental allergies: 2022 skin testing showed: grass pollen, tree pollen, ragweed, weed pollen, cat, dog, and horse,mold, cockroach, dust mites.  Continue environmental control measures.  Use over the counter antihistamines such as Zyrtec (cetirizine), Claritin (loratadine), Allegra (fexofenadine), or Xyzal (levocetirizine) daily as needed. May take twice a day during allergy flares. May switch antihistamines every few months. Use Flonase (fluticasone) nasal spray 1 spray per nostril twice a day as needed for nasal congestion.  Use azelastine nasal spray 1-2 sprays per nostril twice a day as needed for runny nose/drainage.  Return in 4 months or sooner if needed.

## 2021-08-15 NOTE — Progress Notes (Signed)
Follow Up Note  RE: Christine Howell MRN: [id number] DOB: 09-27-1965 Date of Office Visit: 08/15/2021  Referring provider: Renaye Rakers, MD Primary care provider: Renaye Rakers, MD  Chief Complaint: Follow-up  History of Present Illness: I had the pleasure of seeing Marena Howell for a follow up visit at the [facility] on 08/15/2021. She is a 56 y.o. female, who is being followed for asthma, allergic rhinitis. Her previous allergy office visit was on 05/14/2021 with Nehemiah Settle FNP. Today is a regular follow up visit.  Breathing Patient had URI about 2 weeks ago which flared her symptoms - coughing, sneezing, rhinorrhea, sore throat. Used albuterol prn during that time with good benefit.  Symptoms resolved.  Currently on Breo 1 puff once a day. Denies any SOB, coughing, wheezing, chest tightness, nocturnal awakenings, ER/urgent care visits or prednisone use since the last visit.  Seasonal and perennial allergic rhinitis Asymptotic with no medications. Patient states that her environmental allergies are usually worse up [place] and they improved since she moved to [place].  Assessment and Plan: Lyra is a 56 y.o. female with: Moderate persistent asthma without complication Past history - asthma symptoms around pet dander but recently noted some wheezing and difficulty breathing since she had COVID-19 1 year ago.  2022 spirometry showed some restriction with no improvement in FEV1 postbronchodilator treatment.  Clinically feeling slightly improved. Interim history - doing well with Breo and no more wheezing. Had URI a few weeks ago and used albuterol then.  Today's spirometry showed some restriction - improved from previous one.  Daily controller medication(s): Breo 1 puff once a day and rinse mouth after each use.  May use albuterol rescue inhaler 2 puffs every 4 to 6 hours as needed for shortness of breath, chest tightness, coughing,  and wheezing. May use albuterol rescue inhaler 2 puffs 5 to 15 minutes prior to strenuous physical activities. Monitor frequency of use.  Get spirometry at next visit.  Seasonal and perennial allergic rhinitis Past history - Perennial rhinitis symptoms for many years with main trigger being pet dander.  2022 skin testing showed: grass pollen, tree pollen, ragweed, weed pollen, cat, dog, and horse,mold, cockroach, dust mites.  Interim history - asymptomatic currently with no medications. Continue environmental control measures.  Use over the counter antihistamines such as Zyrtec (cetirizine), Claritin (loratadine), Allegra (fexofenadine), or Xyzal (levocetirizine) daily as needed. May take twice a day during allergy flares. May switch antihistamines every few months. Use Flonase (fluticasone) nasal spray 1 spray per nostril twice a day as needed for nasal congestion.  Use azelastine nasal spray 1-2 sprays per nostril twice a day as needed for runny nose/drainage.  Return in about 4 months (around 12/13/2021).  Meds ordered this encounter  Medications   fluticasone furoate-vilanterol (BREO ELLIPTA) 100-25 MCG/ACT AEPB    Sig: Inhale 1 puff into the lungs daily. Rinse mouth after each use.    Dispense:  60 each    Refill:  5   Lab Orders  No laboratory test(s) ordered today    Diagnostics: Spirometry:  Tracings reviewed. Her effort: Good reproducible efforts. FVC: 2.37L FEV1: 2.12L, 82% predicted FEV1/FVC ratio: 89% Interpretation: Spirometry consistent with possible restrictive disease.  Improved from previous one.  Please see scanned spirometry results for details.   Medication List:  Current Outpatient Medications  Medication Sig Dispense Refill   albuterol (PROAIR HFA) 108 (90 Base) MCG/ACT inhaler Inhale 2 puffs into the lungs every 4 (four)  hours as needed for wheezing or shortness of breath (coughing fits). 18 g 1   amLODipine (NORVASC) 5 MG tablet Take 1 tablet (5 mg total)  by mouth daily. 30 tablet 0   azelastine (ASTELIN) 0.1 % nasal spray Use 1 to 2 sprays each nostril twice a day as needed for runny nose/drainage down throat 30 mL 3   fluticasone (FLONASE) 50 MCG/ACT nasal spray Place 1 spray into both nostrils 2 (two) times daily as needed (nasal congestion). 16 g 5   fluticasone furoate-vilanterol (BREO ELLIPTA) 100-25 MCG/ACT AEPB Inhale 1 puff into the lungs daily. Rinse mouth after each use. 60 each 5   hydrochlorothiazide (HYDRODIURIL) 25 MG tablet Take 25 mg by mouth as needed.     rosuvastatin (CRESTOR) 20 MG tablet Take 20 mg by mouth daily.     No current facility-administered medications for this visit.   Allergies: Allergies  Allergen Reactions   Seasonal Ic [Cholestatin]    I reviewed her past medical history, social history, family history, and environmental history and no significant changes have been reported from her previous visit.  Review of Systems  Constitutional:  Negative for appetite change, chills, fever and unexpected weight change.  HENT:  Negative for congestion and rhinorrhea.   Eyes:  Negative for itching.  Respiratory:  Negative for cough, chest tightness, shortness of breath and wheezing.   Cardiovascular:  Negative for chest pain.  Gastrointestinal:  Negative for abdominal pain.  Genitourinary:  Negative for difficulty urinating.  Skin:  Negative for rash.  Neurological:  Negative for headaches.   Objective: BP 130/82    Pulse 73    Temp 97.9 F (36.6 C)    Resp 18    Ht 5\' 8"  (1.727 m)    Wt 278 lb 8 oz (126.3 kg)    SpO2 99%    BMI 42.35 kg/m  Body mass index is 42.35 kg/m. Physical Exam Vitals and nursing note reviewed.  Constitutional:      Appearance: Normal appearance. She is well-developed.  HENT:     Head: Normocephalic and atraumatic.     Right Ear: Tympanic membrane and external ear normal.     Left Ear: Tympanic membrane and external ear normal.     Nose: Nose normal.     Mouth/Throat:     Mouth:  Mucous membranes are moist.     Pharynx: Oropharynx is clear.  Eyes:     Conjunctiva/sclera: Conjunctivae normal.  Cardiovascular:     Rate and Rhythm: Normal rate and regular rhythm.     Heart sounds: Normal heart sounds. No murmur heard.   No friction rub. No gallop.  Pulmonary:     Effort: Pulmonary effort is normal.     Breath sounds: No wheezing, rhonchi or rales.  Musculoskeletal:     Cervical back: Neck supple.  Skin:    General: Skin is warm.     Findings: No rash.  Neurological:     Mental Status: She is alert and oriented to person, place, and time.  Psychiatric:        Behavior: Behavior normal.  Previous notes and tests were reviewed. The plan was reviewed with the patient/family, and all questions/concerned were addressed.  It was my pleasure to see Christine Howell today and participate in her care. Please feel free to contact me with any questions or concerns.  Sincerely,  Selena Batten, DO Allergy & Immunology  [facility] office: [phone] [facility] office: [phone]

## 2021-08-15 NOTE — Assessment & Plan Note (Signed)
Past history - Perennial rhinitis symptoms for many years with main trigger being pet dander.  2022 skin testing showed: grass pollen, tree pollen, ragweed, weed pollen, cat, dog, and horse,mold, cockroach, dust mites.  Interim history - asymptomatic currently with no medications.  Continue environmental control measures.   Use over the counter antihistamines such as Zyrtec (cetirizine), Claritin (loratadine), Allegra (fexofenadine), or Xyzal (levocetirizine) daily as needed. May take twice a day during allergy flares. May switch antihistamines every few months.  Use Flonase (fluticasone) nasal spray 1 spray per nostril twice a day as needed for nasal congestion.   Use azelastine nasal spray 1-2 sprays per nostril twice a day as needed for runny nose/drainage.

## 2021-08-30 ENCOUNTER — Other Ambulatory Visit: Payer: Self-pay | Admitting: Family Medicine

## 2021-08-30 DIAGNOSIS — Z8249 Family history of ischemic heart disease and other diseases of the circulatory system: Secondary | ICD-10-CM

## 2021-08-30 DIAGNOSIS — I10 Essential (primary) hypertension: Secondary | ICD-10-CM

## 2021-09-05 ENCOUNTER — Ambulatory Visit
Admission: RE | Admit: 2021-09-05 | Discharge: 2021-09-05 | Disposition: A | Discharge status: Home / Self Care | Payer: BC Managed Care – PPO | Source: Ambulatory Visit | Attending: Family Medicine | Admitting: Family Medicine

## 2021-09-05 DIAGNOSIS — I10 Essential (primary) hypertension: Secondary | ICD-10-CM

## 2021-09-05 DIAGNOSIS — Z8249 Family history of ischemic heart disease and other diseases of the circulatory system: Secondary | ICD-10-CM

## 2022-01-09 ENCOUNTER — Ambulatory Visit: Payer: BC Managed Care – PPO | Admitting: Allergy

## 2022-09-13 ENCOUNTER — Other Ambulatory Visit: Payer: Self-pay | Admitting: Radiology

## 2022-09-13 ENCOUNTER — Ambulatory Visit: Payer: BC Managed Care – PPO | Attending: Family Medicine

## 2022-09-13 DIAGNOSIS — R002 Palpitations: Secondary | ICD-10-CM

## 2022-09-13 NOTE — Progress Notes (Unsigned)
Applied a 3 day Zio XT monitor to patient in the office  DOD to read

## 2023-01-04 IMAGING — US US AORTA
2 series · 14 of 25 positions shown · non-contrast
Comparison: None.

CLINICAL DATA: Family history of abdominal aortic aneurysm and
history of hypertension.

EXAM:
ULTRASOUND OF ABDOMINAL AORTA
TECHNIQUE: Ultrasound examination of the abdominal aorta and proximal common
iliac arteries was performed to evaluate for aneurysm. Additional
color and Doppler images of the distal aorta were obtained to
document patency.

[Series 1: us aorta · 0.30mm/px · 13 of 32 slices shown (1 of 2)]
[im 1/32]
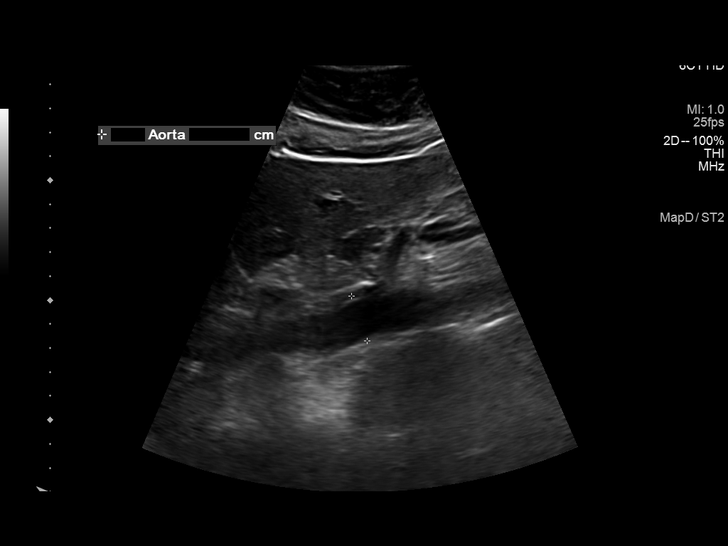
[im 3/32]
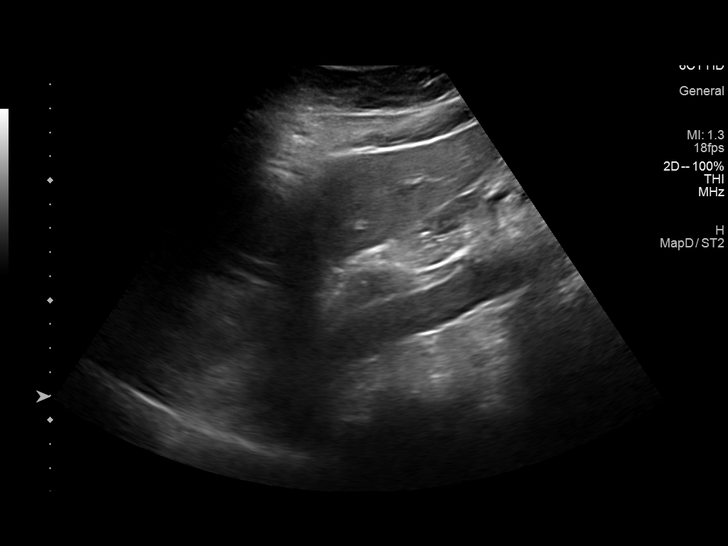
[im 6/32]
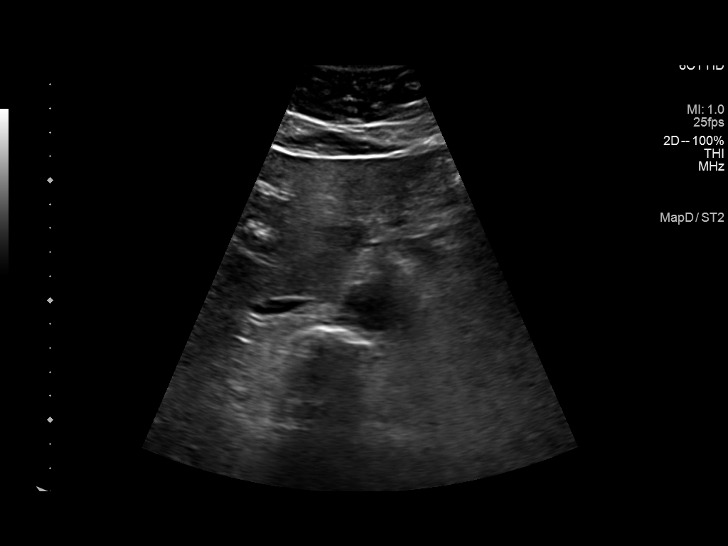
[im 9/32]
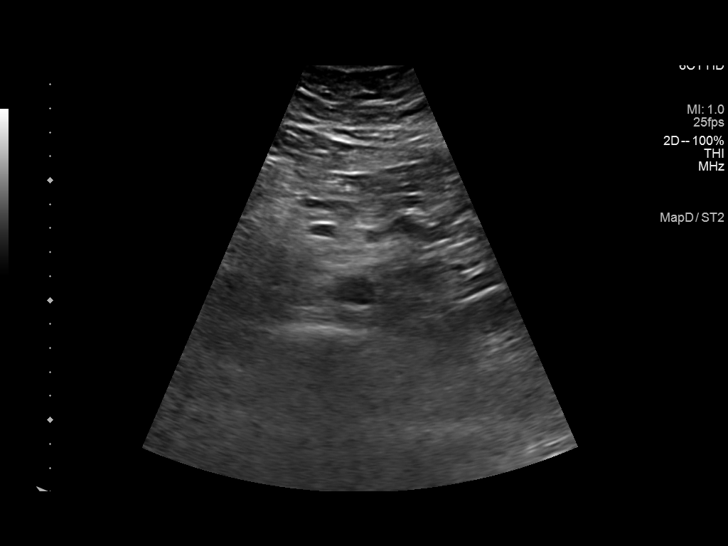
[im 11/32]
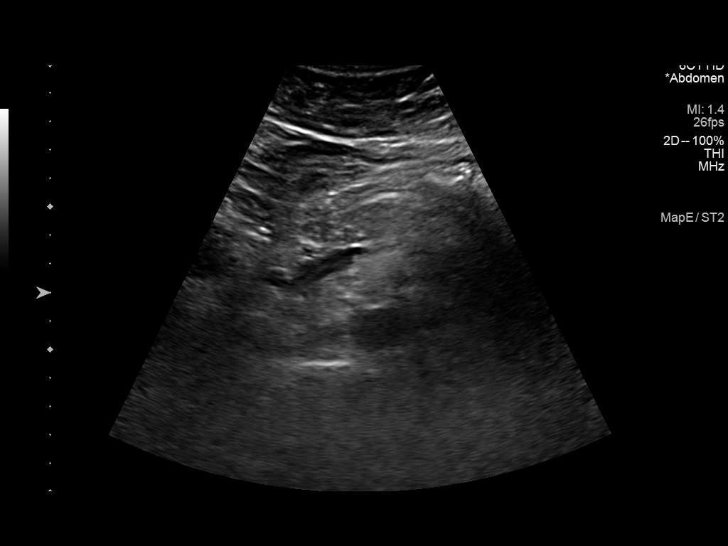
[im 13/32]
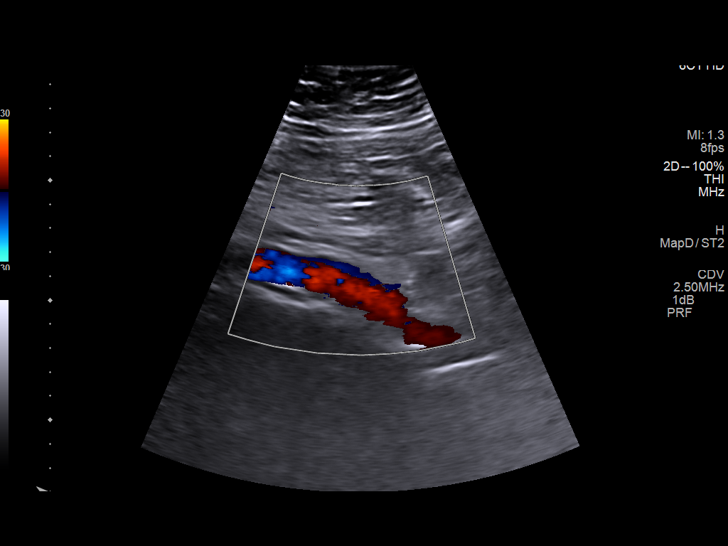
[im 15/32]
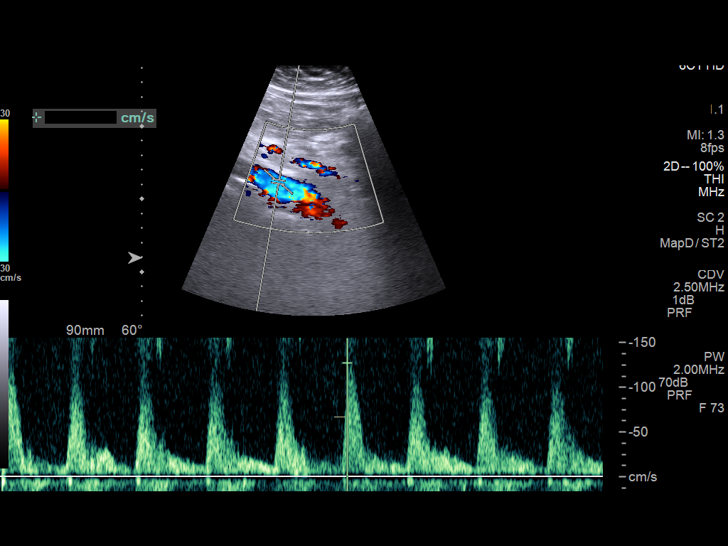
[im 18/32]
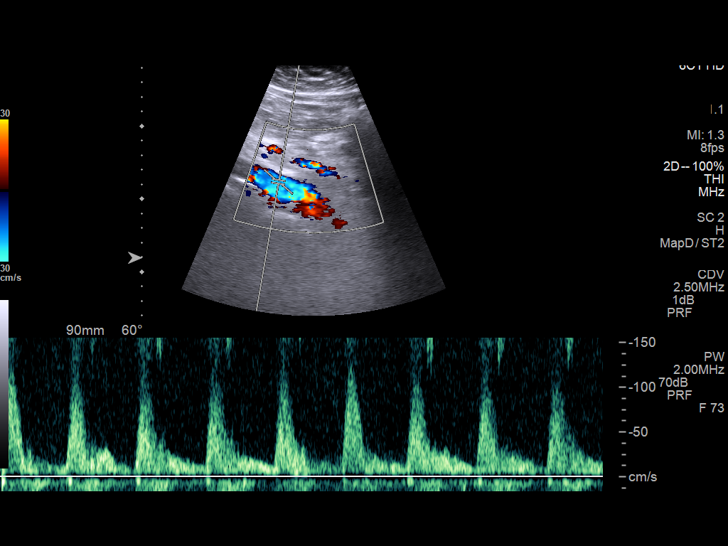
[im 21/32]
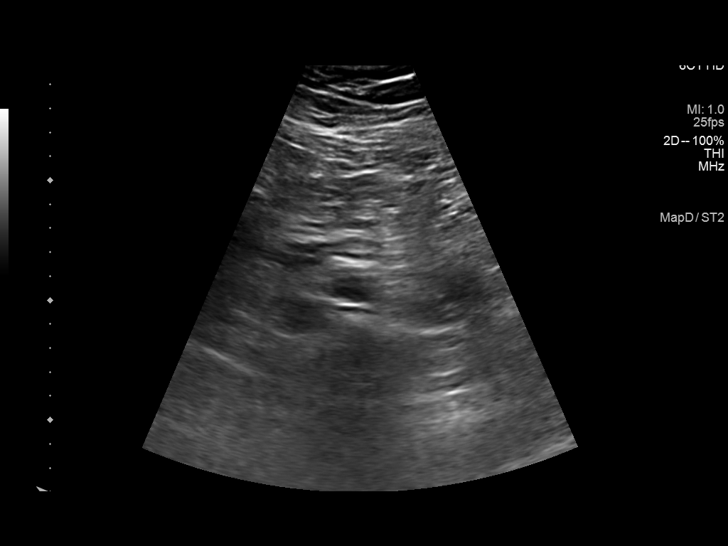
[im 22/32]
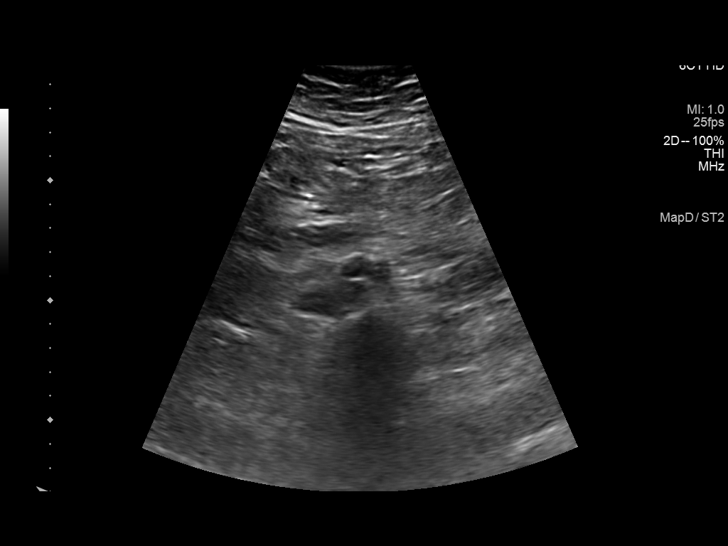
[im 25/32]
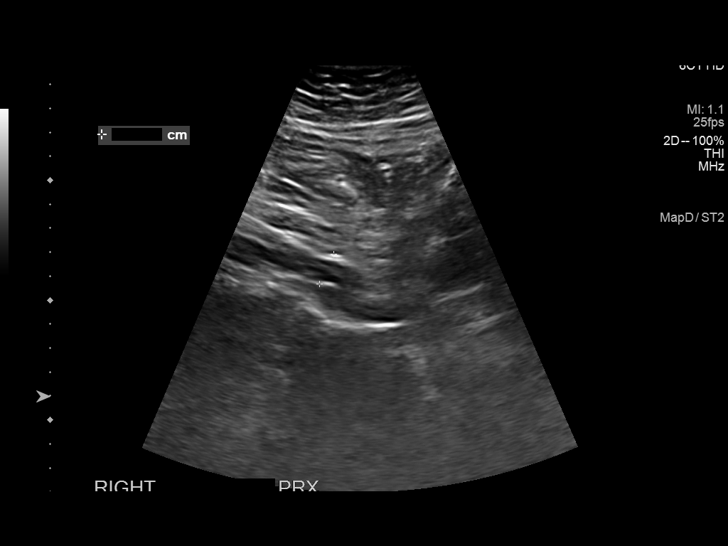
[im 27/32]
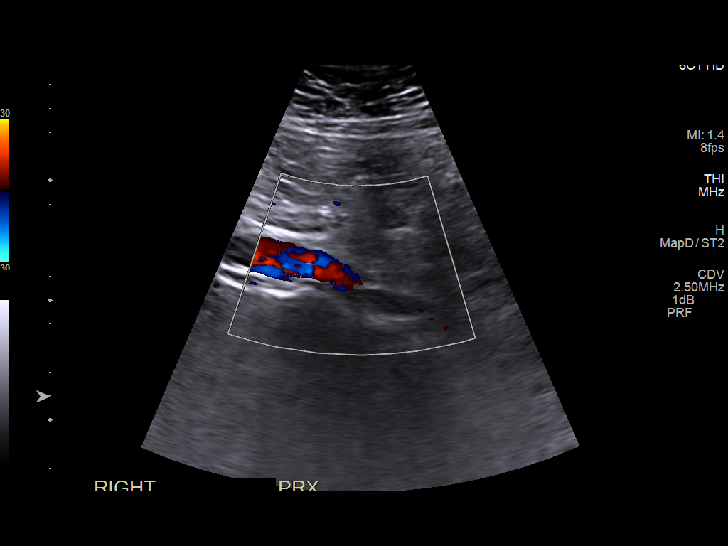
[im 30/32]
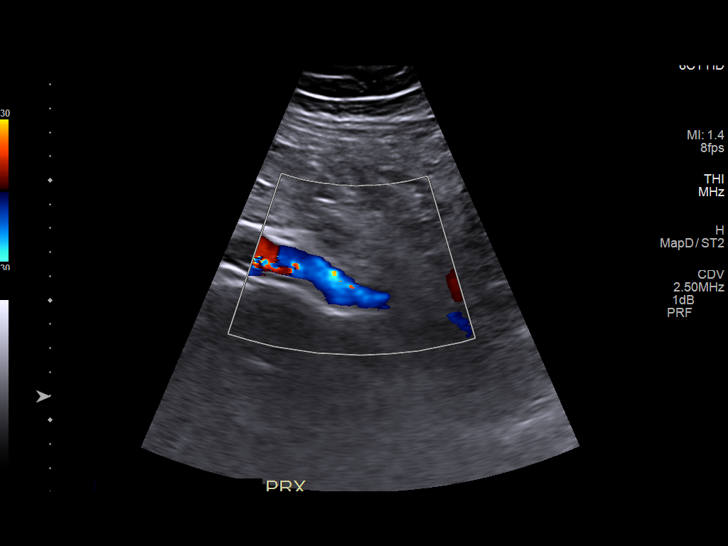

[Series 2001: us aorta · 0.30mm/px · 1 of 2 slices shown (2 of 2)]
[im 1/2]
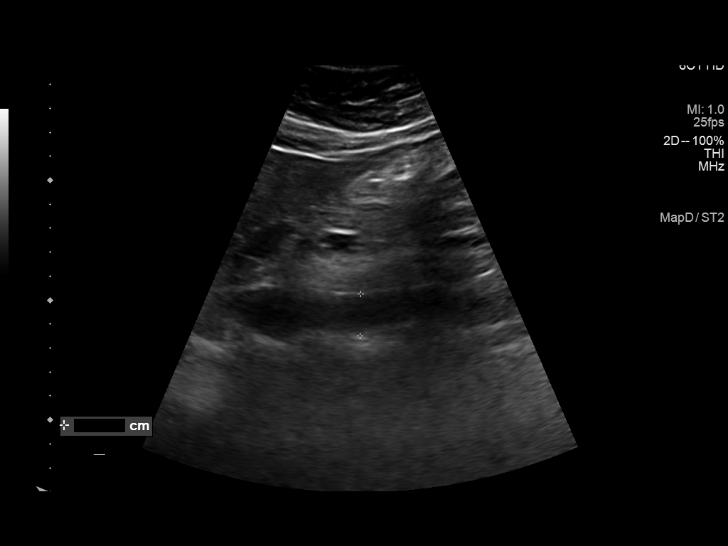

[14 of 25 positions shown; findings below may reference images not displayed]

FINDINGS: Abdominal aortic measurements as follows:

Proximal:  2.0 x 2.4 cm

Mid:  1.8 x 2.0 cm

Distal:  1.9 x 2.1 cm
Patent: Yes, peak systolic velocity is 146 cm/s

Right common iliac artery: 1.4 cm

Left common iliac artery: 1.4 cm
IMPRESSION: No evidence of abdominal aortic aneurysm.

## 2023-03-05 ENCOUNTER — Other Ambulatory Visit: Payer: Self-pay | Admitting: Family Medicine

## 2023-03-05 DIAGNOSIS — K5732 Diverticulitis of large intestine without perforation or abscess without bleeding: Secondary | ICD-10-CM

## 2023-03-06 ENCOUNTER — Ambulatory Visit
Admission: RE | Admit: 2023-03-06 | Discharge: 2023-03-06 | Disposition: A | Discharge status: Home / Self Care | Payer: BC Managed Care – PPO | Source: Ambulatory Visit | Attending: Family Medicine | Admitting: Family Medicine

## 2023-03-06 DIAGNOSIS — K5732 Diverticulitis of large intestine without perforation or abscess without bleeding: Secondary | ICD-10-CM

## 2023-03-06 MED ORDER — IOPAMIDOL (ISOVUE-300) INJECTION 61%
100.0000 mL | Freq: Once | INTRAVENOUS | Status: AC | PRN
  Administered 2023-03-06: 100 mL via INTRAVENOUS

## 2023-04-04 ENCOUNTER — Other Ambulatory Visit: Payer: Self-pay | Admitting: Surgery

## 2023-04-04 DIAGNOSIS — Z809 Family history of malignant neoplasm, unspecified: Secondary | ICD-10-CM

## 2023-04-15 ENCOUNTER — Telehealth: Payer: Self-pay | Admitting: Genetic Counselor

## 2023-04-15 NOTE — Telephone Encounter (Signed)
Per IB message on 04/07/23 I talked to the patient and scheduled Genetic counseling and lab appointment. Patient is aware of date and time

## 2023-06-03 ENCOUNTER — Other Ambulatory Visit: Payer: Self-pay | Admitting: Genetic Counselor

## 2023-06-03 ENCOUNTER — Encounter: Payer: Self-pay | Admitting: Genetic Counselor

## 2023-06-03 ENCOUNTER — Inpatient Hospital Stay: Payer: BC Managed Care – PPO | Attending: Genetic Counselor | Admitting: Genetic Counselor

## 2023-06-03 ENCOUNTER — Inpatient Hospital Stay: Payer: BC Managed Care – PPO

## 2023-06-03 DIAGNOSIS — Z801 Family history of malignant neoplasm of trachea, bronchus and lung: Secondary | ICD-10-CM

## 2023-06-03 DIAGNOSIS — Z803 Family history of malignant neoplasm of breast: Secondary | ICD-10-CM | POA: Insufficient documentation

## 2023-06-03 DIAGNOSIS — Z800 Family history of malignant neoplasm of digestive organs: Secondary | ICD-10-CM

## 2023-06-03 LAB — GENETIC SCREENING ORDER

## 2023-06-03 NOTE — Progress Notes (Signed)
REFERRING PROVIDER: [name], MD [address] [place],  [place] [address]  PRIMARY PROVIDER:  Renaye Rakers, MD  PRIMARY REASON FOR VISIT:  1. Family history of breast cancer   2. Family history of pancreatic cancer   3. Family history of cancer of gallbladder      HISTORY OF PRESENT ILLNESS:   Christine Howell, a 57 y.o. female, was seen for a [facility] cancer genetics consultation at the request of Dr. Freida Busman due to a family history of cancer.  Christine Howell presents to clinic today to discuss the possibility of a hereditary predisposition to cancer, genetic testing, and to further clarify her future cancer risks, as well as potential cancer risks for family members.   Christine Howell is a 57 y.o. female with no personal history of cancer.    CANCER HISTORY:  Oncology History   No history exists.     RISK FACTORS:  Menarche was at age 61-12.  First live birth at age 15.  OCP use for approximately  5-10  years.  Ovaries intact: yes.  Hysterectomy: no.  Menopausal status: postmenopausal.  HRT use: 0 years. Colonoscopy: yes; normal. Mammogram within the last year: no. Number of breast biopsies: 0. Up to date with pelvic exams: no. Any excessive radiation exposure in the past: no  Past Medical History:  Diagnosis Date   Family history of breast cancer    Family history of cancer of gallbladder    Family history of pancreatic cancer    Obesity     No past surgical history on file.  Social History   Socioeconomic History   Marital status: Married    Spouse name: Not on file   Number of children: Not on file   Years of education: Not on file   Highest education level: Not on file  Occupational History   Not on file  Tobacco Use   Smoking status: Never   Smokeless tobacco: Never  Substance and Sexual Activity   Alcohol use: No   Drug use: No   Sexual activity: Not on file  Other Topics Concern   Not on file  Social History  Narrative   Not on file   Social Determinants of Health   Financial Resource Strain: Not on file  Food Insecurity: Not on file  Transportation Needs: Not on file  Physical Activity: Not on file  Stress: Not on file  Social Connections: Not on file     FAMILY HISTORY:  We obtained a detailed, 4-generation family history.  Significant diagnoses are listed below: Family History  Problem Relation Age of Onset   Breast cancer Mother 46   Heart attack Mother 68   Lung cancer Father 44   Cancer Sister 59       Gallbladder   Breast cancer Sister 96   Pancreatic cancer Maternal Uncle    Breast cancer Cousin        pat first cousin   Leukemia Cousin 12       mat first cousin     The patient has two daughters who are cancer free.  She has two full sisters and a brother and two paternal half sisters and one half brother.  One full sister had gallbladder cancer at 20 and her other full sister had breast cancer at 87. Both parents are deceased.  The patient's mother had breast cancer at 70.  She had two sisters and three brothers.  One brother had pancreatic cancer.  This brother had a daughter with leukemia.  There is no other reports of cancer.  The patient's father died of lung cancer.  He had many siblings, but one brother had a daughter with breast cancer.  Christine Howell is unaware of previous family history of genetic testing for hereditary cancer risks. There is no reported Ashkenazi Jewish ancestry. There is no known consanguinity.  GENETIC COUNSELING ASSESSMENT: Ms. Guastella is a 57 y.o. female with a family history of cancer which is somewhat suggestive of a hereditary cancer syndrome and predisposition to cancer given the combination of cancer. We, therefore, discussed and recommended the following at today's visit.   DISCUSSION: We discussed that, in general, most cancer is not inherited in families, but instead is sporadic or familial. Sporadic cancers occur by  chance and typically happen at older ages (>50 years) as this type of cancer is caused by genetic changes acquired during an individual's lifetime. Some families have more cancers than would be expected by chance; however, the ages or types of cancer are not consistent with a known genetic mutation or known genetic mutations have been ruled out. This type of familial cancer is thought to be due to a combination of multiple genetic, environmental, hormonal, and lifestyle factors. While this combination of factors likely increases the risk of cancer, the exact source of this risk is not currently identifiable or testable.  We discussed that 5 - 10% of breast cancer is hereditary, with most cases associated with BRCA mutations.  There are other genes that can be associated with hereditary breast cancer syndromes.  These include ATM, CHEK2 and PALB2.  We discussed that testing is beneficial for several reasons including knowing how to follow individuals after completing their treatment, and understand if other family members could be at risk for cancer and allow them to undergo genetic testing.   We reviewed the characteristics, features and inheritance patterns of hereditary cancer syndromes. We also discussed genetic testing, including the appropriate family members to test, the process of testing, insurance coverage and turn-around-time for results. We discussed the implications of a negative, positive, carrier and/or variant of uncertain significant result. Christine Howell  was offered a common hereditary cancer panel (40+ genes) and an expanded pan-cancer panel (70+ genes). Christine Howell was informed of the benefits and limitations of each panel, including that expanded pan-cancer panels contain genes that do not have clear management guidelines at this point in time.  We also discussed that as the number of genes included on a panel increases, the chances of variants of uncertain significance  increases. Christine Howell decided to pursue genetic testing for the CancerNext-Expanded+RNAinsight gene panel.   The CancerNext-Expanded gene panel offered by [facility] and includes sequencing, rearrangement, and RNA analysis for the following 76 genes: AIP, ALK, APC, ATM, AXIN2, BAP1, BARD1, BMPR1A, BRCA1, BRCA2, BRIP1, CDC73, CDH1, CDK4, CDKN1B, CDKN2A, CEBPA, CHEK2, CTNNA1, DDX41, DICER1, ETV6, FH, FLCN, GATA2, LZTR1, MAX, MBD4, MEN1, MET, MLH1, MSH2, MSH3, MSH6, MUTYH, NF1, NF2, NTHL1, PALB2, PHOX2B, PMS2, POT1, PRKAR1A, PTCH1, PTEN, RAD51C, RAD51D, RB1, RET, RUNX1, SDHA, SDHAF2, SDHB, SDHC, SDHD, SMAD4, SMARCA4, SMARCB1, SMARCE1, STK11, SUFU, TMEM127, TP53, TSC1, TSC2, VHL, and WT1 (sequencing and deletion/duplication); EGFR, HOXB13, KIT, MITF, PDGFRA, POLD1, and POLE (sequencing only); EPCAM and GREM1 (deletion/duplication only).    Based on Christine Howell's family history of cancer, she meets medical criteria for genetic testing. Despite that she meets criteria, she may still have an out of pocket cost. We discussed that if her out  of pocket cost for testing is over $100, the laboratory will call and confirm whether she wants to proceed with testing.  If the out of pocket cost of testing is less than $100 she will be billed by the genetic testing laboratory.   We discussed that some people do not want to undergo genetic testing due to fear of genetic discrimination.  The Genetic Information Nondiscrimination Act (GINA) was signed into federal law in 2008. GINA prohibits health insurers and most employers from discriminating against individuals based on genetic information (including the results of genetic tests and family history information). According to GINA, health insurance companies cannot consider genetic information to be a preexisting condition, nor can they use it to make decisions regarding coverage or rates. GINA also makes it illegal for most employers to use genetic  information in making decisions about hiring, firing, promotion, or terms of employment. It is important to note that GINA does not offer protections for life insurance, disability insurance, or long-term care insurance. GINA does not apply to those in the [employer], those who work for companies with less than 15 employees, and new life insurance or long-term disability insurance policies.  Health status due to a cancer diagnosis is not protected under GINA. More information about GINA can be found by visiting [email or web address].   PLAN: After considering the risks, benefits, and limitations, Christine Howell provided informed consent to pursue genetic testing and the blood sample was sent to [facility] for analysis of the CancerNext-Expanded+RNAinsight. Results should be available within approximately 2-3 weeks' time, at which point they will be disclosed by telephone to Christine Howell, as will any additional recommendations warranted by these results. Christine Howell will receive a summary of her genetic counseling visit and a copy of her results once available. This information will also be available in Epic.   Based on Christine Howell's family history, we recommended her sister, who was diagnosed with breast cancer at age 52, have genetic counseling and testing. Christine Howell will let us know if we can be of any assistance in coordinating genetic counseling and/or testing for this family member.   Lastly, we encouraged Christine Howell to remain in contact with cancer genetics annually so that we can continuously update the family history and inform her of any changes in cancer genetics and testing that may be of benefit for this family.   Christine Howell's questions were answered to her satisfaction today. Our contact information was provided should additional questions or concerns arise. Thank you for the referral and allowing [place] to share in the care of your  patient.   Genesee Nase P. Lowell Guitar, MS, [facility] Licensed, Patent attorney [email or web address] phone: [phone]  The patient was seen for a total of 35 minutes in face-to-face genetic counseling.  The patient was seen alone.  Drs. Meliton Rattan, and/or [place] were available for questions, if needed..    _______________________________________________________________________ For Office Staff:  Number of people involved in session: 1 Was an Intern/ student involved with case: no

## 2023-06-08 ENCOUNTER — Emergency Department (HOSPITAL_COMMUNITY): Payer: BC Managed Care – PPO

## 2023-06-08 ENCOUNTER — Emergency Department (HOSPITAL_COMMUNITY)
Admission: EM | Admit: 2023-06-08 | Discharge: 2023-06-08 | Disposition: A | Discharge status: Home / Self Care | Payer: BC Managed Care – PPO | Attending: Emergency Medicine | Admitting: Emergency Medicine

## 2023-06-08 ENCOUNTER — Encounter (HOSPITAL_COMMUNITY): Payer: Self-pay | Admitting: *Deleted

## 2023-06-08 ENCOUNTER — Other Ambulatory Visit: Payer: Self-pay

## 2023-06-08 DIAGNOSIS — R112 Nausea with vomiting, unspecified: Secondary | ICD-10-CM | POA: Insufficient documentation

## 2023-06-08 DIAGNOSIS — R109 Unspecified abdominal pain: Secondary | ICD-10-CM

## 2023-06-08 DIAGNOSIS — R1011 Right upper quadrant pain: Secondary | ICD-10-CM | POA: Diagnosis not present

## 2023-06-08 DIAGNOSIS — R7989 Other specified abnormal findings of blood chemistry: Secondary | ICD-10-CM | POA: Insufficient documentation

## 2023-06-08 LAB — HEPATIC FUNCTION PANEL
ALT: 21 U/L (ref 0–44)
AST: 23 U/L (ref 15–41)
Albumin: 3.6 g/dL (ref 3.5–5.0)
Alkaline Phosphatase: 68 U/L (ref 38–126)
Bilirubin, Direct: 0.1 mg/dL (ref 0.0–0.2)
Indirect Bilirubin: 0.6 mg/dL (ref 0.3–0.9)
Total Bilirubin: 0.7 mg/dL (ref <1.2)
Total Protein: 6.9 g/dL (ref 6.5–8.1)

## 2023-06-08 LAB — BASIC METABOLIC PANEL
Anion gap: 12 (ref 5–15)
BUN: 16 mg/dL (ref 6–20)
CO2: 24 mmol/L (ref 22–32)
Calcium: 9.2 mg/dL (ref 8.9–10.3)
Chloride: 104 mmol/L (ref 98–111)
Creatinine, Ser: 1.15 mg/dL — ABNORMAL HIGH (ref 0.44–1.00)
GFR, Estimated: 55 mL/min — ABNORMAL LOW (ref >60)
Glucose, Bld: 229 mg/dL — ABNORMAL HIGH (ref 70–99)
Potassium: 4.2 mmol/L (ref 3.5–5.1)
Sodium: 140 mmol/L (ref 135–145)

## 2023-06-08 LAB — URINALYSIS, ROUTINE W REFLEX MICROSCOPIC
Bilirubin Urine: NEGATIVE
Glucose, UA: NEGATIVE mg/dL
Ketones, ur: NEGATIVE mg/dL
Leukocytes,Ua: NEGATIVE
Nitrite: NEGATIVE
Protein, ur: NEGATIVE mg/dL
RBC / HPF: >50 RBC/hpf (ref 0–5)
Specific Gravity, Urine: 1.016 (ref 1.005–1.030)
pH: 7 (ref 5.0–8.0)

## 2023-06-08 LAB — CBC
HCT: 41.1 % (ref 36.0–46.0)
Hemoglobin: 12.9 g/dL (ref 12.0–15.0)
MCH: 28.2 pg (ref 26.0–34.0)
MCHC: 31.4 g/dL (ref 30.0–36.0)
MCV: 89.7 fL (ref 80.0–100.0)
Platelets: 266 10*3/uL (ref 150–400)
RBC: 4.58 MIL/uL (ref 3.87–5.11)
RDW: 13.8 % (ref 11.5–15.5)
WBC: 5.9 10*3/uL (ref 4.0–10.5)
nRBC: 0 % (ref 0.0–0.2)

## 2023-06-08 LAB — LIPASE, BLOOD: Lipase: 28 U/L (ref 11–51)

## 2023-06-08 MED ORDER — HYDROCODONE-ACETAMINOPHEN 5-325 MG PO TABS: 1.0000 | ORAL_TABLET | Freq: Four times a day (QID) | ORAL | 0 refills | Status: DC | PRN

## 2023-06-08 MED ORDER — ONDANSETRON HCL 4 MG/2ML IJ SOLN
4.0000 mg | Freq: Once | INTRAMUSCULAR | Status: AC
  Administered 2023-06-08: 4 mg via INTRAVENOUS
  Filled 2023-06-08: qty 2

## 2023-06-08 MED ORDER — ONDANSETRON HCL 4 MG PO TABS: 4.0000 mg | ORAL_TABLET | Freq: Four times a day (QID) | ORAL | 0 refills | Status: DC | PRN

## 2023-06-08 MED ORDER — HYDROMORPHONE HCL 1 MG/ML IJ SOLN
0.5000 mg | Freq: Once | INTRAMUSCULAR | Status: AC
  Administered 2023-06-08: 0.5 mg via INTRAVENOUS
  Filled 2023-06-08: qty 1

## 2023-06-08 MED ORDER — SODIUM CHLORIDE 0.9 % IV BOLUS: 500.0000 mL | Freq: Once | INTRAVENOUS | Status: DC

## 2023-06-08 NOTE — ED Notes (Signed)
Pt back from CT. Well appearing.

## 2023-06-08 NOTE — ED Provider Notes (Signed)
  [facility] Emergency Department Provider Note MRN:  [id number]  Arrival date & time: 06/08/23     Chief Complaint   Flank Pain   History of Present Illness   Christine Howell is a 57 y.o. year-old female presents to the ED with chief complaint of right sided flank pain.  Started about 2 hours ago.  Hx of gallstones.  Reports associated nausea and vomiting.  Denies urinary symptoms.  Denies any diarrhea.  Denies any other associated symptoms.  History provided by patient.   Review of Systems  Pertinent positive and negative review of systems noted in HPI.    Physical Exam  There were no vitals filed for this visit.  CONSTITUTIONAL:  uncomfortable-appearing, NAD NEURO:  Alert and oriented x 3, CN 3-12 grossly intact EYES:  eyes equal and reactive ENT/NECK:  Supple, no stridor  CARDIO:  normal rate, regular rhythm, appears well-perfused  PULM:  No respiratory distress, CTAB GI/GU:  non-distended, RUQ TTP MSK/SPINE:  No gross deformities, no edema, moves all extremities  SKIN:  no rash, atraumatic   *Additional and/or pertinent findings included in MDM below  Diagnostic and Interventional Summary    EKG Interpretation Date/Time:    Ventricular Rate:    PR Interval:    QRS Duration:    QT Interval:    QTC Calculation:   R Axis:      Text Interpretation:         Labs Reviewed  BASIC METABOLIC PANEL  CBC  URINALYSIS, ROUTINE W REFLEX MICROSCOPIC  LIPASE, BLOOD  HEPATIC FUNCTION PANEL    No orders to display    Medications  HYDROmorphone (DILAUDID) injection 0.5 mg (has no administration in time range)  ondansetron (ZOFRAN) injection 4 mg (has no administration in time range)     Procedures  /  Critical Care Procedures  ED Course and Medical Decision Making  I have reviewed the triage vital signs, the nursing notes, and pertinent available records from the EMR.  Social Determinants Affecting Complexity of Care: Patient has  no clinically significant social determinants affecting this chief complaint..   ED Course: Clinical Course as of 06/08/23 0620  [facility] Jun 08, 2023  0619 Basic metabolic panel(!) Mildly elevated creatinine [RB]  7846 Hepatic function panel Normal t.bili and LFTs [RB]  0619 CBC No leukocytosis [RB]  0620 Lipase, blood Normal lipase, doubt pancreatitis [RB]    Clinical Course User Index [RB] Roxy Horseman, PA-C    Medical Decision Making Amount and/or Complexity of Data Reviewed Labs: ordered. Decision-making details documented in ED Course. Radiology: ordered.  Risk Prescription drug management.         Consultants:    Treatment and Plan: Patient signed out to oncoming team.  Labs and imaging pending.  High degree of suspicion for gallbladder pathology, but kidney stone remains on the differential as well.    Final Clinical Impressions(s) / ED Diagnoses  No diagnosis found.  ED Discharge Orders     None         Discharge Instructions Discussed with and Provided to Patient:   Discharge Instructions   None      Roxy Horseman, PA-C 06/08/23 9629    Nira Conn, MD 06/08/23 [phone]

## 2023-06-08 NOTE — ED Triage Notes (Signed)
C/o sudden onset of right flank pain onset 2 hours ago. C/o nausea and vomiting. States she has a history of Gallstones

## 2023-06-08 NOTE — ED Provider Notes (Signed)
  Physical Exam  BP (!) 136/93   Pulse 69   Temp 98 F (36.7 C) (Oral)   Resp 17   Ht 5\' 8"  (1.727 m)   Wt 124.7 kg   SpO2 95%   BMI 41.81 kg/m   Physical Exam Vitals and nursing note reviewed.  Constitutional:      General: She is not in acute distress.    Appearance: She is not toxic-appearing.  HENT:     Head: Normocephalic and atraumatic.  Pulmonary:     Effort: No respiratory distress.  Abdominal:     Tenderness: There is abdominal tenderness.     Comments: Right upper quadrant tenderness without Murphy sign  Skin:    Coloration: Skin is not jaundiced or pale.  Neurological:     Mental Status: She is alert and oriented to person, place, and time.  Psychiatric:        Behavior: Behavior normal.     Procedures  Procedures  ED Course / MDM   Clinical Course as of 06/08/23 0957  [facility] Jun 08, 2023  0619 Basic metabolic panel(!) Mildly elevated creatinine [RB]  0619 Hepatic function panel Normal t.bili and LFTs [RB]  0619 CBC No leukocytosis [RB]  0620 Lipase, blood Normal lipase, doubt pancreatitis [RB]  0641 History of gallstones. Possibly kidney stone, but tells more of a cholecystitis story. If u/s unremarkable --> CT renal stone study.  [CG]    Clinical Course User Index [CG] Al Decant, PA-C [RB] Roxy Horseman, PA-C   Medical Decision Making Amount and/or Complexity of Data Reviewed Labs: ordered. Decision-making details documented in ED Course. Radiology: ordered.  Risk Prescription drug management.   57 year old female presents for evaluation of right-sided flank/right upper quadrant pain.  Patient signed out to me pending ultrasound imaging, CT renal stone study.  Ultrasound imaging shows cholelithiasis without cholecystitis.  Negative Murphy sign.  Patient CT renal stone study shows no evidence of right-sided nephrolithiasis.  Patient pain well-controlled at this time.  No elevated LFTs, no elevated T. bili.  On chart review,  patient did have surgical consultation with Dr. Freida Busman of [facility] surgery.  Suspect patient symptoms could be secondary to cholelithiasis, symptomatic cholelithiasis.  Patient pain well-controlled here.  She does not appear toxic in appearance or otherwise showing any systemic signs of infection.  She will follow-up with Dr. Freida Busman as an outpatient.  Will send her home with pain control.  Have provided return precautions and she voiced understanding.  Stable to discharge.       Al Decant, PA-C 06/08/23 0957    Elayne Snare K, DO 06/08/23 1031

## 2023-06-08 NOTE — ED Notes (Signed)
This RN reviewed discharge instructions with patient and family. Both verbalized understanding and denied any further questions. PT well appearing upon discharge and reports tolerable pain. Pt ambulated wheeled to exit. Pt endorses ride home.

## 2023-06-08 NOTE — ED Notes (Signed)
This RN assumed care of patient and received off going transfer of care report from off going RN. Pt presented to the ED with right flank pain with N/V from home. Pt has history of gallstones. Pt is resting on gurney at this time, respirations are spontaneous, even, unlabored and symmetrical bilaterally. Pt skin tone is appropriate for ethnicity, dry and warm. Pt connected to CCM, pulse ox and BP. Call bell within reach and bed in lowest position.

## 2023-06-08 NOTE — Discharge Instructions (Addendum)
Please follow-up with Dr. Freida Busman of [facility] surgery.  Please return to the ED with any new or worsening symptoms such as intractable nausea and vomiting, fevers, uncontrollable pain.  Please take Tylenol every 6 hours as needed for pain and if pain not well-controlled on Tylenol, please proceed with hydrocodone.  You may take Zofran every 6 hours as needed for nausea and vomiting.

## 2023-06-10 ENCOUNTER — Ambulatory Visit: Payer: Self-pay | Admitting: Surgery

## 2023-06-10 NOTE — H&P (Signed)
History of Present Illness: Christine Howell is a 57 y.o. female who is seen today for follow up of gallstones. She was first seen about 2 months ago, at which time her gallstones were asymptomatic. Two days ago she woke up in the middle of the night with severe pain in the right upper back behind the shoulder blade. This was associated with nausea and vomiting. She does not remember eating anything in particular before the pain started. She was seen in the ED. A CT scan did not show any obstructing kidney stones, and no signs of cholecystitis. RUQ [place] also did not show cholecystitis. She had known gallstones. LFTs and WBC are normal. She says she has not had any further episodes of pain since then, although she has had indigestion.    At her last visit she was referred for genetic screening, and is awaiting the results of her genetic testing.  She thinks that her sister had gallbladder cancer and died from this in her 66s.  The patient has not had any prior abdominal surgeries.       Review of Systems: A complete review of systems was obtained from the patient.  I have reviewed this information and discussed as appropriate with the patient.  See HPI as well for other ROS.       Medical History: Past Medical History Past Medical History: Diagnosis Date  Arthritis    Hypertension         Problem List There is no problem list on file for this patient.     Past Surgical History History reviewed. No pertinent surgical history.     Allergies No Known Allergies    Medications Ordered Prior to Encounter Current Outpatient Medications on File Prior to Visit Medication Sig Dispense Refill  amLODIPine (NORVASC) 10 MG tablet Take 10 mg by mouth at bedtime      rosuvastatin (CRESTOR) 10 MG tablet TAKE ONE TABLET BY MOUTH DAILY FOR CHOLESTEROL. NEED AN APPOINTMENT FOR ADDITIONAL REFILLS        No current facility-administered medications on file prior to visit.      Family  History Family History Problem Relation Age of Onset  Breast cancer Mother    High blood pressure (Hypertension) Father    Hyperlipidemia (Elevated cholesterol) Father    Diabetes Father    Obesity Sister    High blood pressure (Hypertension) Sister    Hyperlipidemia (Elevated cholesterol) Sister    Diabetes Sister    Breast cancer Sister    High blood pressure (Hypertension) Brother    Diabetes Brother        Tobacco Use History Social History    Tobacco Use Smoking Status Never Smokeless Tobacco Never      Social History Social History    Socioeconomic History  Marital status: Married Tobacco Use  Smoking status: Never  Smokeless tobacco: Never Substance and Sexual Activity  Alcohol use: Never  Drug use: Never      Objective:     Vitals:   06/10/23 0924 BP: 130/80 Pulse: 102 Temp: 36.7 C (98 F) SpO2: 98% Weight: (!) 125.2 kg (276 lb) PainSc: 0-No pain   Body mass index is 41.97 kg/m.   Physical Exam Vitals reviewed.  Constitutional:      General: She is not in acute distress.    Appearance: Normal appearance.  HENT:     Head: Normocephalic and atraumatic.  Eyes:     General: No scleral icterus.    Conjunctiva/sclera: Conjunctivae normal.  Pulmonary:  Effort: Pulmonary effort is normal. No respiratory distress.  Abdominal:     General: There is no distension.     Palpations: Abdomen is soft.     Tenderness: There is no abdominal tenderness.  Musculoskeletal:        General: Normal range of motion.  Skin:    General: Skin is warm and dry.  Neurological:     General: No focal deficit present.     Mental Status: She is alert and oriented to person, place, and time.            Assessment and Plan:   Assessment Diagnoses and all orders for this visit:   Calculus of gallbladder without cholecystitis without obstruction     57 yo female with multiple large gallstones and recent flank/R shoulder pain. Her episode may have been  caused by biliary colic, as no other clear etiology was present on imaging. Given her recent severe pain and history of gallbladder cancer in her sister, it would be reasonable to proceed with cholecystectomy. I also offered continued expectant management, with cholecystectomy if she has further symptoms. She would like to proceed with surgery. The details of laparoscopic cholecystectomy were discussed with the patient, including the risks of bleeding, infection, bile leak, post-cholecystectomy diarrhea, and <0.5% risk of common bile duct injury. The patient expressed understanding and agrees to proceed with surgery. She will be contacted to schedule an elective surgery date.   Sophronia Simas, MD [facility] General, Hepatobiliary and Pancreatic Surgery 06/10/23 10:08 AM

## 2023-06-10 NOTE — H&P (View-Only) (Signed)
 History of Present Illness: Christine Howell is a 57 y.o. female who is seen today for follow up of gallstones. She was first seen about 2 months ago, at which time her gallstones were asymptomatic. Two days ago she woke up in the middle of the night with severe pain in the right upper back behind the shoulder blade. This was associated with nausea and vomiting. She does not remember eating anything in particular before the pain started. She was seen in the ED. A CT scan did not show any obstructing kidney stones, and no signs of cholecystitis. RUQ [place] also did not show cholecystitis. She had known gallstones. LFTs and WBC are normal. She says she has not had any further episodes of pain since then, although she has had indigestion.    At her last visit she was referred for genetic screening, and is awaiting the results of her genetic testing.  She thinks that her sister had gallbladder cancer and died from this in her 66s.  The patient has not had any prior abdominal surgeries.       Review of Systems: A complete review of systems was obtained from the patient.  I have reviewed this information and discussed as appropriate with the patient.  See HPI as well for other ROS.       Medical History: Past Medical History Past Medical History: Diagnosis Date  Arthritis    Hypertension         Problem List There is no problem list on file for this patient.     Past Surgical History History reviewed. No pertinent surgical history.     Allergies No Known Allergies    Medications Ordered Prior to Encounter Current Outpatient Medications on File Prior to Visit Medication Sig Dispense Refill  amLODIPine (NORVASC) 10 MG tablet Take 10 mg by mouth at bedtime      rosuvastatin (CRESTOR) 10 MG tablet TAKE ONE TABLET BY MOUTH DAILY FOR CHOLESTEROL. NEED AN APPOINTMENT FOR ADDITIONAL REFILLS        No current facility-administered medications on file prior to visit.      Family  History Family History Problem Relation Age of Onset  Breast cancer Mother    High blood pressure (Hypertension) Father    Hyperlipidemia (Elevated cholesterol) Father    Diabetes Father    Obesity Sister    High blood pressure (Hypertension) Sister    Hyperlipidemia (Elevated cholesterol) Sister    Diabetes Sister    Breast cancer Sister    High blood pressure (Hypertension) Brother    Diabetes Brother        Tobacco Use History Social History    Tobacco Use Smoking Status Never Smokeless Tobacco Never      Social History Social History    Socioeconomic History  Marital status: Married Tobacco Use  Smoking status: Never  Smokeless tobacco: Never Substance and Sexual Activity  Alcohol use: Never  Drug use: Never      Objective:     Vitals:   06/10/23 0924 BP: 130/80 Pulse: 102 Temp: 36.7 C (98 F) SpO2: 98% Weight: (!) 125.2 kg (276 lb) PainSc: 0-No pain   Body mass index is 41.97 kg/m.   Physical Exam Vitals reviewed.  Constitutional:      General: She is not in acute distress.    Appearance: Normal appearance.  HENT:     Head: Normocephalic and atraumatic.  Eyes:     General: No scleral icterus.    Conjunctiva/sclera: Conjunctivae normal.  Pulmonary:  Effort: Pulmonary effort is normal. No respiratory distress.  Abdominal:     General: There is no distension.     Palpations: Abdomen is soft.     Tenderness: There is no abdominal tenderness.  Musculoskeletal:        General: Normal range of motion.  Skin:    General: Skin is warm and dry.  Neurological:     General: No focal deficit present.     Mental Status: She is alert and oriented to person, place, and time.            Assessment and Plan:   Assessment Diagnoses and all orders for this visit:   Calculus of gallbladder without cholecystitis without obstruction     57 yo female with multiple large gallstones and recent flank/R shoulder pain. Her episode may have been  caused by biliary colic, as no other clear etiology was present on imaging. Given her recent severe pain and history of gallbladder cancer in her sister, it would be reasonable to proceed with cholecystectomy. I also offered continued expectant management, with cholecystectomy if she has further symptoms. She would like to proceed with surgery. The details of laparoscopic cholecystectomy were discussed with the patient, including the risks of bleeding, infection, bile leak, post-cholecystectomy diarrhea, and <0.5% risk of common bile duct injury. The patient expressed understanding and agrees to proceed with surgery. She will be contacted to schedule an elective surgery date.   Sophronia Simas, MD [facility] Surgery General, Hepatobiliary and Pancreatic Surgery 06/10/23 10:08 AM

## 2023-07-03 NOTE — Pre-Procedure Instructions (Signed)
Surgical Instructions   Your procedure is scheduled on July 07, 2023. Report to [facility] Main Entrance "A" at 12:30 P.M., then check in with the Admitting office. Any questions or running late day of surgery: call [phone]  Questions prior to your surgery date: call [phone], Monday-Friday, 8am-4pm. If you experience any cold or flu symptoms such as cough, fever, chills, shortness of breath, etc. between now and your scheduled surgery, please notify us at the above number.     Remember:  Do not eat after midnight the night before your surgery   You may drink clear liquids until 11:30 AM the morning of your surgery.   Clear liquids allowed are: Water, Non-Citrus Juices (without pulp), Carbonated Beverages, Clear Tea (no milk, honey, etc.), Black Coffee Only (NO MILK, CREAM OR POWDERED CREAMER of any kind), and Gatorade.    Take these medicines the morning of surgery with A SIP OF WATER: amLODipine (NORVASC)  rosuvastatin (CRESTOR)    May take these medicines IF NEEDED: albuterol (PROAIR HFA) inhaler - please bring inhaler with you day of surgery fluticasone furoate-vilanterol (BREO ELLIPTA)  ondansetron (ZOFRAN)    One week prior to surgery, STOP taking any Aspirin (unless otherwise instructed by your surgeon) Aleve, Naproxen, Ibuprofen, Motrin, Advil, Goody's, BC's, all herbal medications, fish oil, and non-prescription vitamins.                     Do NOT Smoke (Tobacco/Vaping) for 24 hours prior to your procedure.  If you use a CPAP at night, you may bring your mask/headgear for your overnight stay.   You will be asked to remove any contacts, glasses, piercing's, hearing aid's, dentures/partials prior to surgery. Please bring cases for these items if needed.    Patients discharged the day of surgery will not be allowed to drive home, and someone needs to stay with them for 24 hours.  SURGICAL WAITING ROOM VISITATION Patients may have no more than 2 support  people in the waiting area - these visitors may rotate.   Pre-op nurse will coordinate an appropriate time for 1 ADULT support person, who may not rotate, to accompany patient in pre-op.  Children under the age of 71 must have an adult with them who is not the patient and must remain in the main waiting area with an adult.  If the patient needs to stay at the hospital during part of their recovery, the visitor guidelines for inpatient rooms apply.  Please refer to the [facility] website for the visitor guidelines for any additional information.   If you received a COVID test during your pre-op visit  it is requested that you wear a mask when out in public, stay away from anyone that may not be feeling well and notify your surgeon if you develop symptoms. If you have been in contact with anyone that has tested positive in the last 10 days please notify you surgeon.      Pre-operative CHG Bathing Instructions   You can play a key role in reducing the risk of infection after surgery. Your skin needs to be as free of germs as possible. You can reduce the number of germs on your skin by washing with CHG (chlorhexidine gluconate) soap before surgery. CHG is an antiseptic soap that kills germs and continues to kill germs even after washing.   DO NOT use if you have an allergy to chlorhexidine/CHG or antibacterial soaps. If your skin becomes reddened or irritated, stop using the  CHG and notify one of our RNs at [phone].              TAKE A SHOWER THE NIGHT BEFORE SURGERY AND THE DAY OF SURGERY    Please keep in mind the following:  DO NOT shave, including legs and underarms, 48 hours prior to surgery.   You may shave your face before/day of surgery.  Place clean sheets on your bed the night before surgery Use a clean washcloth (not used since being washed) for each shower. DO NOT sleep with pet's night before surgery.  CHG Shower Instructions:  Wash your face and private area with normal  soap. If you choose to wash your hair, wash first with your normal shampoo.  After you use shampoo/soap, rinse your hair and body thoroughly to remove shampoo/soap residue.  Turn the water OFF and apply half the bottle of CHG soap to a CLEAN washcloth.  Apply CHG soap ONLY FROM YOUR NECK DOWN TO YOUR TOES (washing for 3-5 minutes)  DO NOT use CHG soap on face, private areas, open wounds, or sores.  Pay special attention to the area where your surgery is being performed.  If you are having back surgery, having someone wash your back for you may be helpful. Wait 2 minutes after CHG soap is applied, then you may rinse off the CHG soap.  Pat dry with a clean towel  Put on clean pajamas    Additional instructions for the day of surgery: DO NOT APPLY any lotions, deodorants, cologne, or perfumes.   Do not wear jewelry or makeup Do not wear nail polish, gel polish, artificial nails, or any other type of covering on natural nails (fingers and toes) Do not bring valuables to the hospital. [facility] is not responsible for valuables/personal belongings. Put on clean/comfortable clothes.  Please brush your teeth.  Ask your nurse before applying any prescription medications to the skin.

## 2023-07-04 ENCOUNTER — Encounter (HOSPITAL_COMMUNITY): Payer: Self-pay

## 2023-07-04 ENCOUNTER — Encounter (HOSPITAL_COMMUNITY)
Admission: RE | Admit: 2023-07-04 | Discharge: 2023-07-04 | Disposition: A | Discharge status: Home / Self Care | Payer: BC Managed Care – PPO | Source: Ambulatory Visit | Attending: Surgery | Admitting: Surgery

## 2023-07-04 ENCOUNTER — Other Ambulatory Visit: Payer: Self-pay

## 2023-07-04 ENCOUNTER — Inpatient Hospital Stay: Payer: BC Managed Care – PPO | Attending: Genetic Counselor

## 2023-07-04 VITALS — BP 133/63 | HR 72 | Temp 98.2°F | Resp 18 | Ht 65.0 in | Wt 281.0 lb

## 2023-07-04 DIAGNOSIS — I10 Essential (primary) hypertension: Secondary | ICD-10-CM | POA: Insufficient documentation

## 2023-07-04 DIAGNOSIS — I2510 Atherosclerotic heart disease of native coronary artery without angina pectoris: Secondary | ICD-10-CM | POA: Insufficient documentation

## 2023-07-04 DIAGNOSIS — Z01818 Encounter for other preprocedural examination: Secondary | ICD-10-CM | POA: Insufficient documentation

## 2023-07-04 DIAGNOSIS — R7303 Prediabetes: Secondary | ICD-10-CM | POA: Insufficient documentation

## 2023-07-04 DIAGNOSIS — K8020 Calculus of gallbladder without cholecystitis without obstruction: Secondary | ICD-10-CM | POA: Insufficient documentation

## 2023-07-04 DIAGNOSIS — Z800 Family history of malignant neoplasm of digestive organs: Secondary | ICD-10-CM | POA: Diagnosis not present

## 2023-07-04 DIAGNOSIS — K8010 Calculus of gallbladder with chronic cholecystitis without obstruction: Secondary | ICD-10-CM | POA: Diagnosis present

## 2023-07-04 DIAGNOSIS — J45909 Unspecified asthma, uncomplicated: Secondary | ICD-10-CM | POA: Insufficient documentation

## 2023-07-04 HISTORY — DX: Unspecified osteoarthritis, unspecified site: M19.90

## 2023-07-04 HISTORY — DX: Essential (primary) hypertension: I10

## 2023-07-04 HISTORY — DX: Personal history of urinary calculi: Z87.442

## 2023-07-04 HISTORY — DX: Prediabetes: R73.03

## 2023-07-04 HISTORY — DX: Gastro-esophageal reflux disease without esophagitis: K21.9

## 2023-07-04 HISTORY — DX: Unspecified asthma, uncomplicated: J45.909

## 2023-07-04 LAB — GLUCOSE, CAPILLARY: Glucose-Capillary: 114 mg/dL — ABNORMAL HIGH (ref 70–99)

## 2023-07-04 LAB — GENETIC SCREENING ORDER

## 2023-07-04 NOTE — Progress Notes (Signed)
PCP - Dr. Renaye Rakers Cardiologist - Denies  PPM/ICD - Denies Device Orders - n/a Rep Notified - n/a  Chest x-ray - Denies EKG - 07/04/2023 Stress Test - Denies ECHO - 01/06/2002 Cardiac Cath - Denies  Sleep Study - Denies CPAP - n/a  Pt is Pre-DM  Last dose of GLP1 agonist- n/a GLP1 instructions: n/a  Blood Thinner Instructions: n/a Aspirin Instructions: n/a  ERAS Protcol - Clear liquids until 1130 morning of surgery PRE-SURGERY Ensure or G2- n/a  COVID TEST- n/a   Anesthesia review: No.  Patient denies shortness of breath, fever, cough and chest pain at PAT appointment. Pt denies any respiratory illness/infection in the last two months   All instructions explained to the patient, with a verbal understanding of the material. Patient agrees to go over the instructions while at home for a better understanding. Patient also instructed to self quarantine after being tested for COVID-19. The opportunity to ask questions was provided.

## 2023-07-04 NOTE — Progress Notes (Signed)
Anesthesia Chart Review:  Case: [id number] Date/Time: 07/07/23 1415   Procedure: LAPAROSCOPIC CHOLECYSTECTOMY   Anesthesia type: General   Pre-op diagnosis: GALLSTONES   Location: [facility] OR ROOM 02 / [facility] OR   Surgeons: [name], MD       DISCUSSION: Patient is a 57 year old female scheduled for the above procedure.   History includes never smoker, HTN, GERD, asthma, pre-diabetes. She had an overall unremarkable Zio monitor in March 2024 for palpitations.   She had labs on 06/08/23. LFTs and CBC normal, Creatinine 1.15. Glucose 229 which reported history of only pre-diabetes. CBG checked at PAT and was 114.  Anesthesia team to evaluate prior to surgery.      VS: BP 133/63   Pulse 72   Temp 36.8 C   Resp 18   Ht 5\' 5"  (1.651 m)   Wt 127.5 kg   SpO2 99%   BMI 46.76 kg/m   PROVIDERS: Renaye Rakers, MD is PCP Jeani Hawking, MD is GI Wyline Mood, DO is allergist   LABS: Last lab results in [facility] include: Lab Results  Component Value Date   WBC 5.9 06/08/2023   HGB 12.9 06/08/2023   HCT 41.1 06/08/2023   PLT 266 06/08/2023   GLUCOSE 229 (H) 06/08/2023   ALT 21 06/08/2023   AST 23 06/08/2023   NA 140 06/08/2023   K 4.2 06/08/2023   CL 104 06/08/2023   CREATININE 1.15 (H) 06/08/2023   BUN 16 06/08/2023   CO2 24 06/08/2023   She denied known diabetes diagnosis, but given elevated glucose with last labs a CBG was done in PAT with glucose of 114.   Labs Reviewed  GLUCOSE, CAPILLARY - Abnormal; Notable for the following components:      Result Value   Glucose-Capillary 114 (*)    All other components within normal limits    Spirometry 08/15/21: FVC 2.37 (73%), FEV1 2.12 (82%). FEV1R 0.89 (.80). FEF25-75% 3.19 (127%). Mild restriction.    IMAGES: CT Renal Stone Study 06/08/23: IMPRESSION: 1. There is a 3 mm nonobstructing calculus in the left kidney interpolar region. No other nephroureterolithiasis on either side. No obstructive uropathy. 2. No acute inflammatory  process identified within the abdomen or pelvis. 3. Multiple other nonacute observations, as described above. - Aortic Atherosclerosis (ICD10-I70.0).   EKG: 07/04/23: Sinus rhythm with marked sinus arrhythmia Otherwise normal ECG When compared with ECG of 18-Nov-2003 16:10,   CV: Zio Long Term Monitor 09/13/22 - 09/18/22: NSR with sinus brady and sinus tachycardia Rare PVC's and PAC's No VT or SVT No atrial fib or flutter   [place] Abd Aorta 09/05/21: IMPRESSION: No evidence of abdominal aortic aneurysm.  Echo 01/06/02: SUMMARY   -  Left ventricular ejection fraction was estimated , range being 50         % to 55 %. This study was inadequate for the evaluation of         left ventricular regional wall motion.   -  The images are difficult to assess. After carefull review of all         data, I think that LV function is low normal.    IMPRESSIONS   -  The images are difficult to assess. After carefull review of all         data, I think that LV function is low normal.   Past Medical History:  Diagnosis Date   Arthritis    Asthma    related to pet allergies  Family history of breast cancer    Family history of cancer of gallbladder    Family history of pancreatic cancer    GERD (gastroesophageal reflux disease)    History of kidney stones    Hypertension    Obesity    Pre-diabetes     Past Surgical History:  Procedure Laterality Date   ABLATION  2002   COLONOSCOPY      MEDICATIONS:  albuterol (PROAIR HFA) 108 (90 Base) MCG/ACT inhaler   amLODipine (NORVASC) 10 MG tablet   amLODipine (NORVASC) 5 MG tablet   azelastine (ASTELIN) 0.1 % nasal spray   fluticasone (FLONASE) 50 MCG/ACT nasal spray   fluticasone furoate-vilanterol (BREO ELLIPTA) 100-25 MCG/ACT AEPB   HYDROcodone-acetaminophen (NORCO/VICODIN) 5-325 MG tablet   ondansetron (ZOFRAN) 4 MG tablet   rosuvastatin (CRESTOR) 10 MG tablet   No current facility-administered medications for this encounter.     Shonna Chock, PA-C Surgical Short Stay/Anesthesiology [facility] Phone [phone] [facility] Phone [phone] 07/04/2023 3:58 PM

## 2023-07-04 NOTE — Anesthesia Preprocedure Evaluation (Signed)
Anesthesia Evaluation  Patient identified by MRN, date of birth, ID band Patient awake    Reviewed: Allergy & Precautions, H&P , NPO status , Patient's Chart, lab work & pertinent test results  Airway Mallampati: II  TM Distance: >3 FB Neck ROM: Full    Dental no notable dental hx.    Pulmonary asthma    Pulmonary exam normal breath sounds clear to auscultation       Cardiovascular hypertension, Normal cardiovascular exam Rhythm:Regular Rate:Normal     Neuro/Psych negative neurological ROS  negative psych ROS   GI/Hepatic Neg liver ROS,GERD  ,,gallstones   Endo/Other  negative endocrine ROS    Renal/GU negative Renal ROS  negative genitourinary   Musculoskeletal  (+) Arthritis ,    Abdominal   Peds negative pediatric ROS (+)  Hematology negative hematology ROS (+)   Anesthesia Other Findings   Reproductive/Obstetrics negative OB ROS                             Anesthesia Physical Anesthesia Plan  ASA: 2  Anesthesia Plan: General   Post-op Pain Management:    Induction: Intravenous  PONV Risk Score and Plan: Ondansetron and Dexamethasone  Airway Management Planned: Oral ETT  Additional Equipment:   Intra-op Plan:   Post-operative Plan: Extubation in OR  Informed Consent: I have reviewed the patients History and Physical, chart, labs and discussed the procedure including the risks, benefits and alternatives for the proposed anesthesia with the patient or authorized representative who has indicated his/her understanding and acceptance.     Dental advisory given  Plan Discussed with: CRNA  Anesthesia Plan Comments: (PAT note written 07/04/2023 by Shonna Chock, PA-C.  Patient is a 57 year old female scheduled for the above procedure.    History includes never smoker, HTN, GERD, asthma, pre-diabetes. She had an overall unremarkable Zio monitor in March 2024 for  palpitations.    She had labs on 06/08/23. LFTs and CBC normal, Creatinine 1.15. Glucose 229 which reported history of only pre-diabetes. CBG checked at PAT and was 114.   )       Anesthesia Quick Evaluation

## 2023-07-07 ENCOUNTER — Ambulatory Visit (HOSPITAL_COMMUNITY)
Admission: RE | Admit: 2023-07-07 | Discharge: 2023-07-07 | Disposition: A | Discharge status: Home / Self Care | Payer: BC Managed Care – PPO | Attending: Surgery | Admitting: Surgery

## 2023-07-07 ENCOUNTER — Encounter (HOSPITAL_COMMUNITY)
Admission: RE | Disposition: A | Discharge status: Home / Self Care | Payer: Self-pay | Source: Home / Self Care | Attending: Surgery

## 2023-07-07 ENCOUNTER — Ambulatory Visit (HOSPITAL_COMMUNITY): Payer: Self-pay | Admitting: Vascular Surgery

## 2023-07-07 ENCOUNTER — Other Ambulatory Visit: Payer: Self-pay

## 2023-07-07 ENCOUNTER — Ambulatory Visit (HOSPITAL_COMMUNITY): Payer: BC Managed Care – PPO

## 2023-07-07 ENCOUNTER — Encounter (HOSPITAL_COMMUNITY): Payer: Self-pay | Admitting: Surgery

## 2023-07-07 DIAGNOSIS — K8010 Calculus of gallbladder with chronic cholecystitis without obstruction: Secondary | ICD-10-CM | POA: Diagnosis not present

## 2023-07-07 DIAGNOSIS — I10 Essential (primary) hypertension: Secondary | ICD-10-CM | POA: Insufficient documentation

## 2023-07-07 DIAGNOSIS — R7303 Prediabetes: Secondary | ICD-10-CM | POA: Insufficient documentation

## 2023-07-07 DIAGNOSIS — Z800 Family history of malignant neoplasm of digestive organs: Secondary | ICD-10-CM | POA: Insufficient documentation

## 2023-07-07 DIAGNOSIS — J45909 Unspecified asthma, uncomplicated: Secondary | ICD-10-CM | POA: Insufficient documentation

## 2023-07-07 HISTORY — PX: CHOLECYSTECTOMY: SHX55

## 2023-07-07 SURGERY — LAPAROSCOPIC CHOLECYSTECTOMY
Anesthesia: General | Site: Abdomen

## 2023-07-07 MED ORDER — ORAL CARE MOUTH RINSE: 15.0000 mL | Freq: Once | OROMUCOSAL | Status: AC

## 2023-07-07 MED ORDER — PROPOFOL 10 MG/ML IV BOLUS
INTRAVENOUS | Status: AC
  Filled 2023-07-07: qty 20

## 2023-07-07 MED ORDER — OXYCODONE HCL 5 MG/5ML PO SOLN: 5.0000 mg | Freq: Once | ORAL | Status: AC | PRN

## 2023-07-07 MED ORDER — FENTANYL CITRATE (PF) 100 MCG/2ML IJ SOLN
25.0000 ug | INTRAMUSCULAR | Status: DC | PRN
  Administered 2023-07-07 (×3): 25 ug via INTRAVENOUS

## 2023-07-07 MED ORDER — BUPIVACAINE-EPINEPHRINE (PF) 0.25% -1:200000 IJ SOLN
INTRAMUSCULAR | Status: AC
  Filled 2023-07-07: qty 30

## 2023-07-07 MED ORDER — ROCURONIUM BROMIDE 10 MG/ML (PF) SYRINGE
PREFILLED_SYRINGE | INTRAVENOUS | Status: AC
Start: 2023-07-07 — End: ?
  Filled 2023-07-07: qty 10

## 2023-07-07 MED ORDER — MIDAZOLAM HCL 2 MG/2ML IJ SOLN
INTRAMUSCULAR | Status: AC
  Filled 2023-07-07: qty 2

## 2023-07-07 MED ORDER — SUGAMMADEX SODIUM 200 MG/2ML IV SOLN
INTRAVENOUS | Status: DC | PRN
  Administered 2023-07-07: 50 mg via INTRAVENOUS
  Administered 2023-07-07: 100 mg via INTRAVENOUS
  Administered 2023-07-07: 400 mg via INTRAVENOUS

## 2023-07-07 MED ORDER — DEXAMETHASONE SODIUM PHOSPHATE 10 MG/ML IJ SOLN
INTRAMUSCULAR | Status: AC
  Filled 2023-07-07: qty 2

## 2023-07-07 MED ORDER — DROPERIDOL 2.5 MG/ML IJ SOLN: 0.6250 mg | Freq: Once | INTRAMUSCULAR | Status: DC | PRN

## 2023-07-07 MED ORDER — FENTANYL CITRATE (PF) 100 MCG/2ML IJ SOLN
INTRAMUSCULAR | Status: AC
  Administered 2023-07-07: 25 ug via INTRAVENOUS
  Filled 2023-07-07: qty 2

## 2023-07-07 MED ORDER — CELECOXIB 200 MG PO CAPS
ORAL_CAPSULE | ORAL | Status: AC
  Filled 2023-07-07: qty 1

## 2023-07-07 MED ORDER — PROPOFOL 10 MG/ML IV BOLUS
INTRAVENOUS | Status: DC | PRN
  Administered 2023-07-07: 150 mg via INTRAVENOUS

## 2023-07-07 MED ORDER — NALOXONE HCL 0.4 MG/ML IJ SOLN
INTRAMUSCULAR | Status: DC | PRN
  Administered 2023-07-07 (×2): 120 ug via INTRAVENOUS

## 2023-07-07 MED ORDER — OXYCODONE HCL 5 MG PO TABS
ORAL_TABLET | ORAL | Status: AC
  Filled 2023-07-07: qty 1

## 2023-07-07 MED ORDER — FENTANYL CITRATE (PF) 250 MCG/5ML IJ SOLN
INTRAMUSCULAR | Status: DC | PRN
  Administered 2023-07-07: 50 ug via INTRAVENOUS
  Administered 2023-07-07: 150 ug via INTRAVENOUS

## 2023-07-07 MED ORDER — CELECOXIB 200 MG PO CAPS
200.0000 mg | ORAL_CAPSULE | ORAL | Status: AC
  Administered 2023-07-07: 200 mg via ORAL

## 2023-07-07 MED ORDER — ALBUTEROL SULFATE HFA 108 (90 BASE) MCG/ACT IN AERS
INHALATION_SPRAY | RESPIRATORY_TRACT | Status: DC | PRN
  Administered 2023-07-07: 6 via RESPIRATORY_TRACT

## 2023-07-07 MED ORDER — CHLORHEXIDINE GLUCONATE 0.12 % MT SOLN
15.0000 mL | Freq: Once | OROMUCOSAL | Status: AC
  Administered 2023-07-07: 15 mL via OROMUCOSAL

## 2023-07-07 MED ORDER — BUPIVACAINE-EPINEPHRINE 0.25% -1:200000 IJ SOLN
INTRAMUSCULAR | Status: DC | PRN
  Administered 2023-07-07: 30 mL

## 2023-07-07 MED ORDER — ROCURONIUM BROMIDE 10 MG/ML (PF) SYRINGE
PREFILLED_SYRINGE | INTRAVENOUS | Status: DC | PRN
  Administered 2023-07-07: 70 mg via INTRAVENOUS

## 2023-07-07 MED ORDER — ACETAMINOPHEN 500 MG PO TABS
ORAL_TABLET | ORAL | Status: AC
  Filled 2023-07-07: qty 2

## 2023-07-07 MED ORDER — SODIUM CHLORIDE 0.9 % IR SOLN
Status: DC | PRN
  Administered 2023-07-07: 1000 mL

## 2023-07-07 MED ORDER — LACTATED RINGERS IV SOLN: INTRAVENOUS | Status: DC

## 2023-07-07 MED ORDER — ACETAMINOPHEN 500 MG PO TABS
1000.0000 mg | ORAL_TABLET | ORAL | Status: AC
  Administered 2023-07-07: 1000 mg via ORAL

## 2023-07-07 MED ORDER — HYDROCODONE-ACETAMINOPHEN 5-325 MG PO TABS: 1.0000 | ORAL_TABLET | Freq: Four times a day (QID) | ORAL | 0 refills | Status: AC | PRN

## 2023-07-07 MED ORDER — DEXAMETHASONE SODIUM PHOSPHATE 10 MG/ML IJ SOLN
INTRAMUSCULAR | Status: DC | PRN
  Administered 2023-07-07: 5 mg via INTRAVENOUS

## 2023-07-07 MED ORDER — 0.9 % SODIUM CHLORIDE (POUR BTL) OPTIME
TOPICAL | Status: DC | PRN
  Administered 2023-07-07: 1000 mL

## 2023-07-07 MED ORDER — OXYCODONE HCL 5 MG PO TABS
5.0000 mg | ORAL_TABLET | Freq: Once | ORAL | Status: AC | PRN
  Administered 2023-07-07: 5 mg via ORAL

## 2023-07-07 MED ORDER — FENTANYL CITRATE (PF) 250 MCG/5ML IJ SOLN
INTRAMUSCULAR | Status: AC
  Filled 2023-07-07: qty 5

## 2023-07-07 MED ORDER — SODIUM CHLORIDE 0.9 % IV SOLN
3.0000 g | INTRAVENOUS | Status: AC
  Administered 2023-07-07: 3 g via INTRAVENOUS
  Filled 2023-07-07: qty 3

## 2023-07-07 MED ORDER — MIDAZOLAM HCL 2 MG/2ML IJ SOLN
INTRAMUSCULAR | Status: DC | PRN
  Administered 2023-07-07: 2 mg via INTRAVENOUS

## 2023-07-07 MED ORDER — ONDANSETRON HCL 4 MG/2ML IJ SOLN
INTRAMUSCULAR | Status: DC | PRN
  Administered 2023-07-07: 4 mg via INTRAVENOUS

## 2023-07-07 MED ORDER — CHLORHEXIDINE GLUCONATE 0.12 % MT SOLN
OROMUCOSAL | Status: AC
  Filled 2023-07-07: qty 15

## 2023-07-07 MED ORDER — ACETAMINOPHEN 10 MG/ML IV SOLN: 1000.0000 mg | Freq: Once | INTRAVENOUS | Status: DC | PRN

## 2023-07-07 SURGICAL SUPPLY — 34 items
APPLIER CLIP 5 13 M/L LIGAMAX5 (MISCELLANEOUS) ×1
BAG COUNTER SPONGE SURGICOUNT (BAG) ×1 IMPLANT
CANISTER SUCT 3000ML PPV (MISCELLANEOUS) ×1 IMPLANT
CHLORAPREP W/TINT 26 (MISCELLANEOUS) ×1 IMPLANT
CLIP APPLIE 5 13 M/L LIGAMAX5 (MISCELLANEOUS) ×1 IMPLANT
COVER SURGICAL LIGHT HANDLE (MISCELLANEOUS) ×1 IMPLANT
DERMABOND ADVANCED .7 DNX12 (GAUZE/BANDAGES/DRESSINGS) ×1 IMPLANT
ELECT REM PT RETURN 9FT ADLT (ELECTROSURGICAL) ×1
ELECTRODE REM PT RTRN 9FT ADLT (ELECTROSURGICAL) ×1 IMPLANT
GLOVE BIOGEL PI IND STRL 6 (GLOVE) ×1 IMPLANT
GLOVE BIOGEL PI MICRO STRL 5.5 (GLOVE) ×1 IMPLANT
GOWN STRL REUS W/ TWL LRG LVL3 (GOWN DISPOSABLE) ×3 IMPLANT
IRRIG SUCT STRYKERFLOW 2 WTIP (MISCELLANEOUS) ×1
IRRIGATION SUCT STRKRFLW 2 WTP (MISCELLANEOUS) ×1 IMPLANT
KIT BASIN OR (CUSTOM PROCEDURE TRAY) ×1 IMPLANT
KIT TURNOVER KIT B (KITS) ×1 IMPLANT
L-HOOK LAP DISP 36CM (ELECTROSURGICAL) ×1
LHOOK LAP DISP 36CM (ELECTROSURGICAL) ×1 IMPLANT
NS IRRIG 1000ML POUR BTL (IV SOLUTION) ×1 IMPLANT
PAD ARMBOARD 7.5X6 YLW CONV (MISCELLANEOUS) ×1 IMPLANT
PENCIL BUTTON HOLSTER BLD 10FT (ELECTRODE) ×1 IMPLANT
POUCH RETRIEVAL ECOSAC 10 (ENDOMECHANICALS) IMPLANT
SCISSORS LAP 5X35 DISP (ENDOMECHANICALS) ×1 IMPLANT
SET TUBE SMOKE EVAC HIGH FLOW (TUBING) ×1 IMPLANT
SLEEVE Z-THREAD 5X100MM (TROCAR) ×2 IMPLANT
SUT MNCRL AB 4-0 PS2 18 (SUTURE) ×1 IMPLANT
SUT VICRYL 0 UR6 27IN ABS (SUTURE) IMPLANT
TOWEL GREEN STERILE (TOWEL DISPOSABLE) ×1 IMPLANT
TOWEL GREEN STERILE FF (TOWEL DISPOSABLE) ×1 IMPLANT
TRAY LAPAROSCOPIC MC (CUSTOM PROCEDURE TRAY) ×1 IMPLANT
TROCAR BALLN 12MMX100 BLUNT (TROCAR) ×1 IMPLANT
TROCAR Z-THREAD OPTICAL 5X100M (TROCAR) ×1 IMPLANT
WARMER LAPAROSCOPE (MISCELLANEOUS) ×1 IMPLANT
WATER STERILE IRR 1000ML POUR (IV SOLUTION) ×1 IMPLANT

## 2023-07-07 NOTE — Interval H&P Note (Signed)
History and Physical Interval Note:  07/07/2023 2:28 PM  Christine Howell  has presented today for surgery, with the diagnosis of GALLSTONES.  The various methods of treatment have been discussed with the patient and family. After consideration of risks, benefits and other options for treatment, the patient has consented to  Procedure(s): LAPAROSCOPIC CHOLECYSTECTOMY (N/A) as a surgical intervention.  The patient's history has been reviewed, patient examined, no change in status, stable for surgery.  I have reviewed the patient's chart and labs.  Questions were answered to the patient's satisfaction.     [name]

## 2023-07-07 NOTE — Op Note (Signed)
Date: 07/07/23  Patient: Christine Howell MRN: [id number]  Preoperative Diagnosis: Symptomatic cholelithiasis Postoperative Diagnosis: Same  Procedure: Laparoscopic cholecystectomy  Surgeon: Sophronia Simas, MD  EBL: Minimal  Anesthesia: General endotracheal  Specimens: Gallbladder  Indications: Ms. Howell is a 57 yo female who was referred with large gallstones and a family history of gallbladder cancer. She also began having right upper back pain with nausea and vomiting. After a discussion of the risks and benefits of surgery, she agreed to proceed with cholecystectomy.  Findings: Multiple very large calculi within the gallbladder. No signs of acute cholecystitis.  Procedure details: Informed consent was obtained in the preoperative area prior to the procedure. The patient was brought to the operating room and placed on the table in the supine position. General anesthesia was induced and appropriate lines and drains were placed for intraoperative monitoring. Perioperative antibiotics were administered per SCIP guidelines. The abdomen was prepped and draped in the usual sterile fashion. A pre-procedure timeout was taken verifying patient identity, surgical site and procedure to be performed.  A small infraumbilical skin incision was made through a previous surgical scar, the subcutaneous tissue was divided with cautery, and the umbilical stalk was grasped and elevated. The fascia was incised and the peritoneal cavity was directly visualized. A 12mm Hassan trocar was placed and the abdomen was insufflated. The peritoneal cavity was inspected with no evidence of visceral or vascular injury. Three 5mm ports were placed in the right subcostal margin, all under direct visualization. The fundus of the gallbladder was grasped and retracted cephalad. The infundibulum was retracted laterally and the peritoneum overlying the gallbladder was incised with cautery. The cystic triangle was  dissected out using cautery and blunt dissection, and the critical view of safety was obtained. The cystic duct and cystic artery were clipped and divided, leaving two clips behind on the cystic duct stump. The gallbladder was taken off the liver using cautery, and the specimen was placed in an endocatch bag. The surgical site was irrigated with saline until the effluent was clear. Hemostasis was achieved in the gallbladder fossa using cautery. The cystic duct and artery stumps were visually inspected and there was no evidence of bile leak or bleeding. The ports were removed under direct visualization and the abdomen was desufflated. The specimen was extracted via the umbilical port site - this required extension of the fascial incision due to the size of the stones within the gallbladder. The umbilical port site fascia was closed with 0 Vicryl figure-of-eight sutures. The skin at all port sites was closed with 4-0 monocryl subcuticular suture. Dermabond was applied.  The patient tolerated the procedure well with no apparent complications. All counts were correct x2 at the end of the procedure. The patient was extubated and taken to PACU in stable condition.  Sophronia Simas, MD 07/07/23 3:53 PM

## 2023-07-07 NOTE — Discharge Instructions (Addendum)
[facility] DISCHARGE INSTRUCTIONS  Activity No heavy lifting greater than 15 pounds for 4 weeks after surgery. Ok to shower in 24 hours, but do not bathe or submerge incisions underwater. Do not drive while taking narcotic pain medication. You may drive when you are no longer taking prescription pain medication, you can comfortably wear a seatbelt, and you can safely maneuver your car and apply brakes.  Wound Care Your incisions are covered with skin glue called Dermabond. This will peel off on its own over time. You may shower and allow warm soapy water to run over your incisions. Gently pat dry. Do not submerge your incision underwater until cleared by your surgeon. Monitor your incision for any new redness, tenderness, or drainage. Many patients will experience some swelling and bruising at the incisions.  Ice packs will help.  Swelling and bruising can take several days to resolve.   Medications A  prescription for pain medication may be given to you upon discharge.  Take your pain medication as prescribed, if needed.  If narcotic pain medicine is not needed, then you may take acetaminophen (Tylenol) or ibuprofen (Advil) as needed. It is common to experience some constipation if taking pain medication after surgery.  Increasing fluid intake and taking a stool softener (such as Colace) will usually help or prevent this problem from occurring.  A mild laxative (Milk of Magnesia or Miralax) should be taken according to package directions if there are no bowel movements after 48 hours. Take your usually prescribed medications unless otherwise directed. If you need a refill on your pain medication, please contact your pharmacy.  They will contact our office to request authorization. Prescriptions will not be filled after 5 pm or on weekends.  When to Call us: Fever greater than 100.5 New redness, drainage, or swelling at incision site Severe pain, nausea, or  vomiting Persistent bleeding from incisions Jaundice (yellowing of the whites of the eyes or skin)  Follow-up You have an appointment scheduled with Dr. Freida Busman on July 28, 2022 at 11:10am. This will be at the [facility] office at [address], [place], [place]. Please arrive at least 15 minutes prior to your scheduled appointment time.  IF YOU HAVE DISABILITY OR FAMILY LEAVE FORMS, YOU MUST BRING THEM TO THE OFFICE FOR PROCESSING.   DO NOT GIVE THEM TO YOUR DOCTOR.  The clinic staff is available to answer your questions during regular business hours.  Please don't hesitate to call and ask to speak to one of the nurses for clinical concerns.  If you have a medical emergency, go to the nearest emergency room or call 911.  A surgeon from [facility] is always on call at the hospital  [address], [address], [place], [place]  [address] ?  [address] [address], [place], [place]   [address] [phone] ? Toll Free: [phone] ? FAX [phone] Web site: [email or web address]      Managing Your Pain After Surgery Without Opioids    Thank you for participating in our program to help patients manage their pain after surgery without opioids. This is part of our effort to provide you with the best care possible, without exposing you or your family to the risk that opioids pose.  What pain can I expect after surgery? You can expect to have some pain after surgery. This is normal. The pain is typically worse the day after surgery, and quickly begins to get better. Many studies have  found that many patients are able to manage their pain after surgery with Over-the-Counter (OTC) medications such as Tylenol and Motrin. If you have a condition that does not allow you to take Tylenol or Motrin, notify your surgical team.  How will I manage my pain? The best strategy for controlling your pain after surgery is around the clock pain control with  Tylenol (acetaminophen) and Motrin (ibuprofen or Advil). Alternating these medications with each other allows you to maximize your pain control. In addition to Tylenol and Motrin, you can use heating pads or ice packs on your incisions to help reduce your pain.  How will I alternate your regular strength over-the-counter pain medication? You will take a dose of pain medication every three hours. Start by taking 650 mg of Tylenol (2 pills of 325 mg) 3 hours later take 600 mg of Motrin (3 pills of 200 mg) 3 hours after taking the Motrin take 650 mg of Tylenol 3 hours after that take 600 mg of Motrin.   - 1 -  See example - if your first dose of Tylenol is at 12:00 PM   12:00 PM Tylenol 650 mg (2 pills of 325 mg)  3:00 PM Motrin 600 mg (3 pills of 200 mg)  6:00 PM Tylenol 650 mg (2 pills of 325 mg)  9:00 PM Motrin 600 mg (3 pills of 200 mg)  Continue alternating every 3 hours   We recommend that you follow this schedule around-the-clock for at least 3 days after surgery, or until you feel that it is no longer needed. Use the table on the last page of this handout to keep track of the medications you are taking. Important: Do not take more than 3000mg  of Tylenol or 3200mg  of Motrin in a 24-hour period. Do not take ibuprofen/Motrin if you have a history of bleeding stomach ulcers, severe kidney disease, &/or actively taking a blood thinner  What if I still have pain? If you have pain that is not controlled with the over-the-counter pain medications (Tylenol and Motrin or Advil) you might have what we call "breakthrough" pain. You will receive a prescription for a small amount of an opioid pain medication such as Oxycodone, Tramadol, or Tylenol with Codeine. Use these opioid pills in the first 24 hours after surgery if you have breakthrough pain. Do not take more than 1 pill every 4-6 hours.  If you still have uncontrolled pain after using all opioid pills, don't hesitate to call our staff  using the number provided. We will help make sure you are managing your pain in the best way possible, and if necessary, we can provide a prescription for additional pain medication.   Day 1    Time  Name of Medication Number of pills taken  Amount of Acetaminophen  Pain Level   Comments  AM PM       AM PM       AM PM       AM PM       AM PM       AM PM       AM PM       AM PM       Total Daily amount of Acetaminophen Do not take more than  3,000 mg per day      Day 2    Time  Name of Medication Number of pills taken  Amount of Acetaminophen  Pain Level   Comments  AM PM  AM PM       AM PM       AM PM       AM PM       AM PM       AM PM       AM PM       Total Daily amount of Acetaminophen Do not take more than  3,000 mg per day      Day 3    Time  Name of Medication Number of pills taken  Amount of Acetaminophen  Pain Level   Comments  AM PM       AM PM       AM PM       AM PM         AM PM       AM PM       AM PM       AM PM       Total Daily amount of Acetaminophen Do not take more than  3,000 mg per day      Day 4    Time  Name of Medication Number of pills taken  Amount of Acetaminophen  Pain Level   Comments  AM PM       AM PM       AM PM       AM PM       AM PM       AM PM       AM PM       AM PM       Total Daily amount of Acetaminophen Do not take more than  3,000 mg per day      Day 5    Time  Name of Medication Number of pills taken  Amount of Acetaminophen  Pain Level   Comments  AM PM       AM PM       AM PM       AM PM       AM PM       AM PM       AM PM       AM PM       Total Daily amount of Acetaminophen Do not take more than  3,000 mg per day      Day 6    Time  Name of Medication Number of pills taken  Amount of Acetaminophen  Pain Level  Comments  AM PM       AM PM       AM PM       AM PM       AM PM       AM PM       AM PM       AM PM       Total Daily amount of  Acetaminophen Do not take more than  3,000 mg per day      Day 7    Time  Name of Medication Number of pills taken  Amount of Acetaminophen  Pain Level   Comments  AM PM       AM PM       AM PM       AM PM       AM PM       AM PM       AM PM       AM PM  Total Daily amount of Acetaminophen Do not take more than  3,000 mg per day        For additional information about how and where to safely dispose of unused opioid medications - [email or web address]  Disclaimer: This document contains information and/or instructional materials adapted from [facility] for the typical patient with your condition. It does not replace medical advice from your health care provider because your experience may differ from that of the typical patient. Talk to your health care provider if you have any questions about this document, your condition or your treatment plan. Adapted from [facility]

## 2023-07-07 NOTE — Anesthesia Procedure Notes (Signed)
Procedure Name: Intubation Date/Time: 07/07/2023 3:00 PM  Performed by: Marquis Buggy, CRNAPre-anesthesia Checklist: Patient identified, Emergency Drugs available, Suction available and Patient being monitored Patient Re-evaluated:Patient Re-evaluated prior to induction Oxygen Delivery Method: Circle system utilized Preoxygenation: Pre-oxygenation with 100% oxygen Induction Type: IV induction Ventilation: Mask ventilation without difficulty Laryngoscope Size: Mac and 3 Grade View: Grade I Tube type: Oral Tube size: 7.0 mm Number of attempts: 1 Airway Equipment and Method: Stylet

## 2023-07-07 NOTE — Transfer of Care (Signed)
Immediate Anesthesia Transfer of Care Note  Patient: Christine Howell  Procedure(s) Performed: LAPAROSCOPIC CHOLECYSTECTOMY (Abdomen)  Patient Location: PACU  Anesthesia Type:General  Level of Consciousness: awake, alert , and oriented  Airway & Oxygen Therapy: Patient Spontanous Breathing  Post-op Assessment: Report given to RN and Post -op Vital signs reviewed and stable  Post vital signs: Reviewed and stable  Last Vitals:  Vitals Value Taken Time  BP 142/79 07/07/23 1623  Temp 36.6 C 07/07/23 1622  Pulse 83 07/07/23 1630  Resp 15 07/07/23 1630  SpO2 100 % 07/07/23 1630  Vitals shown include unfiled device data.  Last Pain:  Vitals:   07/07/23 1251  PainSc: 4          Complications: No notable events documented.

## 2023-07-08 ENCOUNTER — Encounter (HOSPITAL_COMMUNITY): Payer: Self-pay | Admitting: Surgery

## 2023-07-09 NOTE — Anesthesia Postprocedure Evaluation (Signed)
Anesthesia Post Note  Patient: Christine Howell  Procedure(s) Performed: LAPAROSCOPIC CHOLECYSTECTOMY (Abdomen)     Anesthesia Type: General Anesthetic complications: no   No notable events documented.  Last Vitals:  Vitals:   07/07/23 1645 07/07/23 1700  BP: 134/80 (!) 142/83  Pulse: 78 76  Resp: 15 12  Temp:  36.6 C  SpO2: 100% 100%    Last Pain:  Vitals:   07/07/23 1700  PainSc: 4                  [place]

## 2023-07-10 LAB — SURGICAL PATHOLOGY

## 2023-07-29 ENCOUNTER — Encounter: Payer: Self-pay | Admitting: Genetic Counselor

## 2023-07-29 ENCOUNTER — Telehealth: Payer: Self-pay | Admitting: Genetic Counselor

## 2023-07-29 ENCOUNTER — Ambulatory Visit: Payer: Self-pay | Admitting: Genetic Counselor

## 2023-07-29 DIAGNOSIS — Z1379 Encounter for other screening for genetic and chromosomal anomalies: Secondary | ICD-10-CM | POA: Insufficient documentation

## 2023-07-29 NOTE — Telephone Encounter (Signed)
 Revealed negative genetic testing.  Discussed that we do not know why there is cancer in the family. It could be due to a different gene that we are not testing, or maybe our current technology may not be able to pick something up.  It will be important for her to keep in contact with genetics to keep up with whether additional testing may be needed.   Her Christine Howell is 21.9% risk for breast cancer.  We will refer her to the high risk breast clinic.

## 2023-07-29 NOTE — Progress Notes (Addendum)
 HPI:  Christine Howell was previously seen in the [facility] due to a family history of cancer and concerns regarding a hereditary predisposition to cancer. Please refer to our prior cancer genetics clinic note for more information regarding our discussion, assessment and recommendations, at the time. Christine Howell's recent genetic test results were disclosed to her, as were recommendations warranted by these results. These results and recommendations are discussed in more detail below.  CANCER HISTORY:  Oncology History   No history exists.    FAMILY HISTORY:  We obtained a detailed, 4-generation family history.  Significant diagnoses are listed below: Family History  Problem Relation Age of Onset   Breast cancer Mother 59   Heart attack Mother 34   Lung cancer Father 36   Cancer Sister 8       Gallbladder   Breast cancer Sister 50   Pancreatic cancer Maternal Uncle    Breast cancer Cousin        pat first cousin   Leukemia Cousin 12       mat first cousin       The patient has two daughters who are cancer free.  She has two full sisters and a brother and two paternal half sisters and one half brother.  One full sister had gallbladder cancer at 38 and her other full sister had breast cancer at 61. Both parents are deceased.   The patient's mother had breast cancer at 63.  She had two sisters and three brothers.  One brother had pancreatic cancer.  This brother had a daughter with leukemia.  There is no other reports of cancer.   The patient's father died of lung cancer.  He had many siblings, but one brother had a daughter with breast cancer.   Christine Howell is unaware of previous family history of genetic testing for hereditary cancer risks. There is no reported Ashkenazi Jewish ancestry. There is no known consanguinity.  GENETIC TEST RESULTS: Genetic testing reported out on July 28, 2023 through the CancerNext-Expanded+RNAinsight cancer  panel found no pathogenic mutations. The CancerNext-Expanded gene panel offered by [facility] and includes sequencing, rearrangement, and RNA analysis for the following 76 genes: AIP, ALK, APC, ATM, AXIN2, BAP1, BARD1, BMPR1A, BRCA1, BRCA2, BRIP1, CDC73, CDH1, CDK4, CDKN1B, CDKN2A, CEBPA, CHEK2, CTNNA1, DDX41, DICER1, ETV6, FH, FLCN, GATA2, LZTR1, MAX, MBD4, MEN1, MET, MLH1, MSH2, MSH3, MSH6, MUTYH, NF1, NF2, NTHL1, PALB2, PHOX2B, PMS2, POT1, PRKAR1A, PTCH1, PTEN, RAD51C, RAD51D, RB1, RET, RUNX1, SDHA, SDHAF2, SDHB, SDHC, SDHD, SMAD4, SMARCA4, SMARCB1, SMARCE1, STK11, SUFU, TMEM127, TP53, TSC1, TSC2, VHL, and WT1 (sequencing and deletion/duplication); EGFR, HOXB13, KIT, MITF, PDGFRA, POLD1, and POLE (sequencing only); EPCAM and GREM1 (deletion/duplication only). The test report has been scanned into EPIC and is located under the Molecular Pathology section of the Results Review tab.  A portion of the result report is included below for reference.     We discussed with Christine Howell that because current genetic testing is not perfect, it is possible there may be a gene mutation in one of these genes that current testing cannot detect, but that chance is small.  We also discussed, that there could be another gene that has not yet been discovered, or that we have not yet tested, that is responsible for the cancer diagnoses in the family. It is also possible there is a hereditary cause for the cancer in the family that Christine Howell did not inherit and therefore was not identified in her testing.  Therefore, it is important to remain in touch with cancer genetics in the future so that we can continue to offer Christine Howell the most up to date genetic testing.   ADDITIONAL GENETIC TESTING: We discussed with Christine Howell that her genetic testing was fairly extensive.  If there are genes identified to increase cancer risk that can be analyzed in the future, we would be happy to discuss  and coordinate this testing at that time.    CANCER SCREENING RECOMMENDATIONS: Christine Howell's test result is considered negative (normal).  This means that we have not identified a hereditary cause for her family history of cancer at this time. Most cancers happen by chance and this negative test suggests that her family history of cancer may fall into this category.    Possible reasons for Christine Howell's negative genetic test include:  1. There may be a gene mutation in one of these genes that current testing methods cannot detect but that chance is small.  2. There could be another gene that has not yet been discovered, or that we have not yet tested, that is responsible for the cancer diagnoses in the family.  3.  There may be no hereditary risk for cancer in the family. The cancers in Christine Howell and/or her family may be sporadic/familial or due to other genetic and environmental factors. 4. It is also possible there is a hereditary cause for the cancer in the family that Christine Howell did not inherit.  Therefore, it is recommended she continue to follow the cancer management and screening guidelines provided by her primary healthcare provider. An individual's cancer risk and medical management are not determined by genetic test results alone. Overall cancer risk assessment incorporates additional factors, including personal medical history, family history, and any available genetic information that may result in a personalized plan for cancer prevention and surveillance  The Tyrer-Cuzick model is one of multiple prediction models developed to estimate an individual's lifetime risk of developing breast cancer. The Tyrer-Cuzick model is endorsed by the [employer] (NCCN). This model includes many risk factors such as family history, endogenous estrogen exposure, and benign breast disease. The calculation is highly-dependent on the accuracy of  clinical data provided by the patient and can change over time. The Tyrer-Cuzick model may be repeated to reflect new information in her personal or family history in the future.    Christine Howell has been determined to be at high risk for breast cancer. her Tyrer-Cuzick risk score is 21.9%.  For women with a greater than 20% lifetime risk of breast cancer, the [employer] (NCCN) recommends the following:   1.      Clinical encounter every 6-12 months to begin when identified as being at increased risk, but not before age 58  2.      Annual mammograms. Tomosynthesis is recommended starting 10 years earlier than the youngest breast cancer diagnosis in the family or at age 68 (whichever comes first), but not before age 73    66.      Annual breast MRI starting 10 years earlier than the youngest breast cancer diagnosis in the family or at age 54 (whichever comes first), but not before age 41.   We discussed that it is reasonable for Christine Howell to be followed by a high-risk breast cancer clinic; in addition to a yearly mammogram and physical exam by a healthcare provider, she should discuss the usefulness of an annual breast MRI with the high-risk  clinic providers.   We have placed a referral to the [facility] High Risk Breast clinic.  RECOMMENDATIONS FOR FAMILY MEMBERS:   Since she did not inherit a identifiable mutation in a cancer predisposition gene included on this panel, her children could not have inherited a known mutation from her in one of these genes. Individuals in this family might be at some increased risk of developing cancer, over the general population risk, simply due to the family history of cancer.  We recommended women in this family have a yearly mammogram beginning at age 69, or 56 years younger than the earliest onset of cancer, an annual clinical breast exam, and perform monthly breast self-exams. Women in this family should also have a  gynecological exam as recommended by their primary provider. All family members should be referred for colonoscopy starting at age 73, or 10 years younger than the earliest onset of cancer. It is also possible there is a hereditary cause for the cancer in Christine Howell's family that she did not inherit and therefore was not identified in her.  Based on Christine Howell's family history, we recommended her sister, who was diagnosed with breast cancer at age 47, have genetic counseling and testing. Christine Howell will let us  know if we can be of any assistance in coordinating genetic counseling and/or testing for this family member.   FOLLOW-UP: Lastly, we discussed with Christine Howell that cancer genetics is a rapidly advancing field and it is possible that new genetic tests will be appropriate for her and/or her family members in the future. We encouraged her to remain in contact with cancer genetics on an annual basis so we can update her personal and family histories and let her know of advances in cancer genetics that may benefit this family.   Our contact number was provided. Christine Howell's questions were answered to her satisfaction, and she knows she is welcome to call us  at anytime with additional questions or concerns.   Darice Monte, MS, [facility] Licensed, Certified Genetic Counselor [email or web address]

## 2023-12-19 ENCOUNTER — Other Ambulatory Visit: Payer: Self-pay | Admitting: Family Medicine

## 2023-12-19 ENCOUNTER — Ambulatory Visit
Admission: RE | Admit: 2023-12-19 | Discharge: 2023-12-19 | Disposition: A | Discharge status: Home / Self Care | Source: Ambulatory Visit | Attending: Family Medicine | Admitting: Family Medicine

## 2023-12-19 DIAGNOSIS — M549 Dorsalgia, unspecified: Secondary | ICD-10-CM

## 2024-07-22 ENCOUNTER — Ambulatory Visit (INDEPENDENT_AMBULATORY_CARE_PROVIDER_SITE_OTHER)

## 2024-07-22 ENCOUNTER — Encounter (HOSPITAL_COMMUNITY): Payer: Self-pay

## 2024-07-22 ENCOUNTER — Ambulatory Visit (HOSPITAL_COMMUNITY)
Admission: EM | Admit: 2024-07-22 | Discharge: 2024-07-22 | Disposition: A | Discharge status: Home / Self Care | Attending: Emergency Medicine | Admitting: Emergency Medicine

## 2024-07-22 DIAGNOSIS — J988 Other specified respiratory disorders: Secondary | ICD-10-CM | POA: Diagnosis not present

## 2024-07-22 DIAGNOSIS — J4541 Moderate persistent asthma with (acute) exacerbation: Secondary | ICD-10-CM

## 2024-07-22 DIAGNOSIS — R051 Acute cough: Secondary | ICD-10-CM

## 2024-07-22 DIAGNOSIS — B9789 Other viral agents as the cause of diseases classified elsewhere: Secondary | ICD-10-CM

## 2024-07-22 LAB — POCT INFLUENZA A/B
Influenza A, POC: NEGATIVE
Influenza B, POC: NEGATIVE

## 2024-07-22 LAB — POC SOFIA SARS ANTIGEN FIA: SARS Coronavirus 2 Ag: NEGATIVE

## 2024-07-22 MED ORDER — ACETAMINOPHEN 325 MG PO TABS
ORAL_TABLET | ORAL | Status: AC
  Filled 2024-07-22: qty 2

## 2024-07-22 MED ORDER — IPRATROPIUM-ALBUTEROL 0.5-2.5 (3) MG/3ML IN SOLN
3.0000 mL | Freq: Once | RESPIRATORY_TRACT | Status: AC
  Administered 2024-07-22: 3 mL via RESPIRATORY_TRACT

## 2024-07-22 MED ORDER — ALBUTEROL SULFATE (2.5 MG/3ML) 0.083% IN NEBU
INHALATION_SOLUTION | RESPIRATORY_TRACT | Status: AC
  Filled 2024-07-22: qty 3

## 2024-07-22 MED ORDER — ALBUTEROL SULFATE (2.5 MG/3ML) 0.083% IN NEBU
2.5000 mg | INHALATION_SOLUTION | Freq: Once | RESPIRATORY_TRACT | Status: AC
  Administered 2024-07-22: 2.5 mg via RESPIRATORY_TRACT

## 2024-07-22 MED ORDER — BENZONATATE 100 MG PO CAPS: 100.0000 mg | ORAL_CAPSULE | Freq: Three times a day (TID) | ORAL | 0 refills | Status: AC

## 2024-07-22 MED ORDER — PREDNISONE 20 MG PO TABS
40.0000 mg | ORAL_TABLET | Freq: Once | ORAL | Status: AC
  Administered 2024-07-22: 40 mg via ORAL

## 2024-07-22 MED ORDER — PROMETHAZINE-DM 6.25-15 MG/5ML PO SYRP: 5.0000 mL | ORAL_SOLUTION | Freq: Every evening | ORAL | 0 refills | Status: AC | PRN

## 2024-07-22 MED ORDER — PREDNISONE 20 MG PO TABS: 40.0000 mg | ORAL_TABLET | Freq: Every day | ORAL | 0 refills | Status: AC

## 2024-07-22 MED ORDER — ACETAMINOPHEN 325 MG PO TABS
650.0000 mg | ORAL_TABLET | Freq: Once | ORAL | Status: AC
  Administered 2024-07-22: 650 mg via ORAL

## 2024-07-22 MED ORDER — IPRATROPIUM-ALBUTEROL 0.5-2.5 (3) MG/3ML IN SOLN
RESPIRATORY_TRACT | Status: AC
  Filled 2024-07-22: qty 3

## 2024-07-22 MED ORDER — PREDNISONE 20 MG PO TABS
ORAL_TABLET | ORAL | Status: AC
  Filled 2024-07-22: qty 2

## 2024-07-22 MED ORDER — ALBUTEROL SULFATE HFA 108 (90 BASE) MCG/ACT IN AERS: 1.0000 | INHALATION_SPRAY | Freq: Four times a day (QID) | RESPIRATORY_TRACT | 0 refills | Status: AC | PRN

## 2024-07-22 NOTE — ED Provider Notes (Signed)
 " [facility]    CSN: [id number] Arrival date & time: 07/22/24  1341      History   Chief Complaint Chief Complaint  Patient presents with   Cough   Shortness of Breath   Fatigue   Dizziness   Generalized Body Aches    HPI Christine Howell is a 59 y.o. female.   Patient presents with cough, chest congestion, nausea, fatigue, body aches, shortness of breath, intermittent dizziness, and chest pain with cough that began yesterday.  Patient reports that she initially began with a very mild cough and her symptoms have progressed rapidly and she feels much worse today.    Patient denies any known fever. Denies vomiting, diarrhea, and abdominal pain.  Patient does report a history of asthma.  Patient reports that she has been using her albuterol  inhaler with minimal relief.  Patient denies taking any other medication for symptoms.  Patient denies any known sick exposures.  Of note patient also reports a history of diabetes and states that she is currently on Mounjaro for this.  The history is provided by the patient and medical records.  Cough Associated symptoms: shortness of breath   Shortness of Breath Associated symptoms: cough   Dizziness Associated symptoms: shortness of breath     Past Medical History:  Diagnosis Date   Arthritis    Asthma    related to pet allergies   Family history of breast cancer    Family history of cancer of gallbladder    Family history of pancreatic cancer    GERD (gastroesophageal reflux disease)    History of kidney stones    Hypertension    Obesity    Pre-diabetes     Patient Active Problem List   Diagnosis Date Noted   Genetic testing 07/29/2023   Family history of breast cancer    Family history of pancreatic cancer    Family history of cancer of gallbladder    Moderate persistent asthma without complication 08/15/2021   Seasonal and perennial allergic rhinitis 04/10/2021   Other atopic dermatitis 04/10/2021    HAV (hallux abducto valgus) 02/23/2016    Past Surgical History:  Procedure Laterality Date   ABLATION  2002   CHOLECYSTECTOMY N/A 07/07/2023   Procedure: LAPAROSCOPIC CHOLECYSTECTOMY;  Surgeon: Dasie Leonor CROME, MD;  Location: [facility] OR;  Service: General;  Laterality: N/A;   COLONOSCOPY      OB History   No obstetric history on file.      Home Medications    Prior to Admission medications  Medication Sig Start Date End Date Taking? Authorizing Provider  albuterol  (VENTOLIN  HFA) 108 (90 Base) MCG/ACT inhaler Inhale 1-2 puffs into the lungs every 6 (six) hours as needed for wheezing or shortness of breath. 07/22/24  Yes Johnie, Daylene Vandenbosch A, NP  benzonatate  (TESSALON ) 100 MG capsule Take 1 capsule (100 mg total) by mouth every 8 (eight) hours. 07/22/24  Yes Leyna Vanderkolk A, NP  MOUNJARO 15 MG/0.5ML Pen INJECT 15 MG EVERY WEEK BY SUBCUTANEOUS ROUTE AS DIRECTED FOR 60 DAYS, FOR DIABETES. 06/19/24  Yes [provider]  predniSONE  (DELTASONE ) 20 MG tablet Take 2 tablets (40 mg total) by mouth daily for 4 days. 07/22/24 07/26/24 Yes Johnie Flaming A, NP  promethazine -dextromethorphan (PROMETHAZINE -DM) 6.25-15 MG/5ML syrup Take 5 mLs by mouth at bedtime as needed for cough. 07/22/24  Yes Johnie, Rima Blizzard A, NP  amLODipine  (NORVASC ) 10 MG tablet Take 10 mg by mouth daily.    [provider]  amLODipine  (NORVASC ) 5 MG tablet Take 1 tablet (5 mg total) by mouth daily. Patient not taking: Reported on 07/03/2023 12/16/17   Gretta Ozell CROME, PA-C  rosuvastatin (CRESTOR) 10 MG tablet Take 10 mg by mouth daily.    [provider]    Family History Family History  Problem Relation Age of Onset   Breast cancer Mother 41   Heart attack Mother 67   Lung cancer Father 55   Cancer Sister 39       Gallbladder   Breast cancer Sister 51   Pancreatic cancer Maternal Uncle    Breast cancer Cousin        pat first cousin   Leukemia Cousin 12       mat first cousin    Social  History Social History[1]   Allergies   Patient has no known allergies.   Review of Systems Review of Systems  Respiratory:  Positive for cough and shortness of breath.   Neurological:  Positive for dizziness.   Per HPI  Physical Exam Triage Vital Signs ED Triage Vitals [07/22/24 1451]  Encounter Vitals Group     BP 114/65     Girls Systolic BP Percentile      Girls Diastolic BP Percentile      Boys Systolic BP Percentile      Boys Diastolic BP Percentile      Pulse Rate (!) 118     Resp 18     Temp (!) 102.9 F (39.4 C)     Temp Source Oral     SpO2 91 %     Weight      Height      Head Circumference      Peak Flow      Pain Score 3     Pain Loc      Pain Education      Exclude from Growth Chart    No data found.  Updated Vital Signs BP 96/83 (BP Location: Left Arm)   Pulse (!) 109   Temp 100.2 F (37.9 C) (Oral)   Resp 16   SpO2 92%   Visual Acuity Right Eye Distance:   Left Eye Distance:   Bilateral Distance:    Right Eye Near:   Left Eye Near:    Bilateral Near:     Physical Exam Vitals and nursing note reviewed.  Constitutional:      General: She is awake. She is not in acute distress.    Appearance: Normal appearance. She is well-developed and well-groomed. She is ill-appearing. She is not toxic-appearing or diaphoretic.  HENT:     Right Ear: Tympanic membrane, ear canal and external ear normal.     Left Ear: Tympanic membrane, ear canal and external ear normal.     Nose: Congestion present.     Mouth/Throat:     Mouth: Mucous membranes are moist.     Pharynx: Posterior oropharyngeal erythema present. No oropharyngeal exudate.  Cardiovascular:     Rate and Rhythm: Regular rhythm. Tachycardia present.  Pulmonary:     Effort: Pulmonary effort is normal.     Breath sounds: Decreased air movement present. Wheezing present.  Skin:    General: Skin is warm and dry.  Neurological:     Mental Status: She is alert.  Psychiatric:         Behavior: Behavior is cooperative.      UC Treatments / Results  Labs (all labs ordered are listed, but only abnormal results are  displayed) Labs Reviewed  POC SOFIA SARS ANTIGEN FIA - Normal  POCT INFLUENZA A/B - Normal    EKG   Radiology DG Chest 2 View Result Date: 07/22/2024 CLINICAL DATA:  cough, fever, chest pain with cough, wheezing, shortness of breath began yesterday EXAM: CHEST - 2 VIEW COMPARISON:  11/18/2003 FINDINGS: Cardiomediastinal silhouette and pulmonary vasculature are within normal limits. Lungs are clear. IMPRESSION: No acute cardiopulmonary process. Electronically Signed   By: Aliene Lloyd M.D.   On: 07/22/2024 16:45    Procedures Procedures (including critical care time)  Medications Ordered in UC Medications  acetaminophen  (TYLENOL ) tablet 650 mg (650 mg Oral Given 07/22/24 1459)  ipratropium-albuterol  (DUONEB) 0.5-2.5 (3) MG/3ML nebulizer solution 3 mL (3 mLs Nebulization Given 07/22/24 1541)  albuterol  (PROVENTIL ) (2.5 MG/3ML) 0.083% nebulizer solution 2.5 mg (2.5 mg Nebulization Given 07/22/24 1706)  predniSONE  (DELTASONE ) tablet 40 mg (40 mg Oral Given 07/22/24 1705)    Initial Impression / Assessment and Plan / UC Course  I have reviewed the triage vital signs and the nursing notes.  Pertinent labs & imaging results that were available during my care of the patient were reviewed by me and considered in my medical decision making (see chart for details).     Patient is ill-appearing.  Patient initially febrile at 102.9.  Tachycardia present.  Wheezing and decreased air movement auscultated throughout.  COVID and flu testing negative.  Ordered chest x-ray to rule out underlying pneumonia.  I independently interpreted these images and there is no active cardiopulmonary disease.  Radiology report confirms this.  Given DuoNeb, albuterol , and additional dose of 40 mg prednisone  in clinic with significant relief of wheezing and symptomatic relief.  SpO2 also  increased to 97%.  I suspect symptoms are likely related to a viral respiratory illness that has exacerbated underlying asthma.  Prescribed short prednisone  burst for asthma exacerbation.  Prescribed albuterol  inhaler for wheezing and shortness of breath.  Recommended continue with Breztri inhaler daily.  Prescribed Tessalon  and Promethazine  DM as needed for cough.  Discussed importance of fever management.  Discussed follow-up, return, and strict ER precautions. Final Clinical Impressions(s) / UC Diagnoses   Final diagnoses:  Acute cough  Viral respiratory illness  Moderate persistent asthma with acute exacerbation     Discharge Instructions      Your flu and COVID testing both negative today.  Your x-ray does not reveal any underlying pneumonia. As discussed I believe your symptoms are likely related to a viral respiratory illness that has exacerbated your asthma. You are given an initial dose of prednisone  in clinic today so tomorrow start taking 2 tablets of prednisone  once daily for the next 4 days. Continue using Breztri inhaler twice daily for maintenance. Use albuterol  inhaler 1 to 2 puffs every 4-6 hours as needed for wheezing or shortness of breath. You can take Tessalon  every 8 hours as needed for cough. You can take Promethazine  DM as needed for cough at bedtime.  This can make you drowsy so do not drive, work, or drink alcohol while taking this. Take 500 to 1000 mg of Tylenol  every 6-8 hours as needed for any fever or pain. Make sure you are staying hydrated and getting plenty of rest. Follow-up with your primary care provider or return here as needed. If you develop severe chest pain, difficulty breathing, persistent high fevers, or severe weakness please seek immediate medical treatment in the emergency department.     ED Prescriptions     Medication Sig Dispense Auth.  Provider   predniSONE  (DELTASONE ) 20 MG tablet Take 2 tablets (40 mg total) by mouth daily for 4  days. 8 tablet Johnie Flaming A, NP   albuterol  (VENTOLIN  HFA) 108 (90 Base) MCG/ACT inhaler Inhale 1-2 puffs into the lungs every 6 (six) hours as needed for wheezing or shortness of breath. 18 g Johnie Flaming A, NP   benzonatate  (TESSALON ) 100 MG capsule Take 1 capsule (100 mg total) by mouth every 8 (eight) hours. 21 capsule Johnie Flaming A, NP   promethazine -dextromethorphan (PROMETHAZINE -DM) 6.25-15 MG/5ML syrup Take 5 mLs by mouth at bedtime as needed for cough. 118 mL Johnie Flaming A, NP      PDMP not reviewed this encounter.    [1]  Social History Tobacco Use   Smoking status: Never   Smokeless tobacco: Never  Vaping Use   Vaping status: Never Used  Substance Use Topics   Alcohol use: No   Drug use: No     Johnie Flaming LABOR, NP 07/22/24 1740  "

## 2024-07-22 NOTE — ED Triage Notes (Signed)
 Patient reports that she has been  having a cough, nausea, SOB, fatigue, dizziness, and body aches since yesterday.   Patient has not had any medications for her symptoms.

## 2024-07-22 NOTE — Discharge Instructions (Signed)
 Your flu and COVID testing both negative today.  Your x-ray does not reveal any underlying pneumonia. As discussed I believe your symptoms are likely related to a viral respiratory illness that has exacerbated your asthma. You are given an initial dose of prednisone  in clinic today so tomorrow start taking 2 tablets of prednisone  once daily for the next 4 days. Continue using Breztri inhaler twice daily for maintenance. Use albuterol  inhaler 1 to 2 puffs every 4-6 hours as needed for wheezing or shortness of breath. You can take Tessalon  every 8 hours as needed for cough. You can take Promethazine  DM as needed for cough at bedtime.  This can make you drowsy so do not drive, work, or drink alcohol while taking this. Take 500 to 1000 mg of Tylenol  every 6-8 hours as needed for any fever or pain. Make sure you are staying hydrated and getting plenty of rest. Follow-up with your primary care provider or return here as needed. If you develop severe chest pain, difficulty breathing, persistent high fevers, or severe weakness please seek immediate medical treatment in the emergency department.
